# Patient Record
Sex: Female | Born: 1970 | Race: White | Hispanic: No | Smoking: Never smoker
Health system: Southern US, Community
[De-identification: ages and names within clinical notes are randomized; demographics above are authoritative.]

## PROBLEM LIST (undated history)

## (undated) DIAGNOSIS — F419 Anxiety disorder, unspecified: Secondary | ICD-10-CM

## (undated) DIAGNOSIS — E079 Disorder of thyroid, unspecified: Secondary | ICD-10-CM

## (undated) DIAGNOSIS — K589 Irritable bowel syndrome without diarrhea: Secondary | ICD-10-CM

## (undated) HISTORY — PX: BREAST SURGERY: SHX581

## (undated) HISTORY — DX: Disorder of thyroid, unspecified: E07.9

## (undated) HISTORY — PX: NOSE SURGERY: SHX723

## (undated) HISTORY — DX: Anxiety disorder, unspecified: F41.9

## (undated) HISTORY — PX: UTERINE FIBROID SURGERY: SHX826

---

## 2019-07-07 DIAGNOSIS — R0902 Hypoxemia: Secondary | ICD-10-CM | POA: Insufficient documentation

## 2019-07-13 ENCOUNTER — Emergency Department: Payer: Self-pay

## 2019-07-13 ENCOUNTER — Observation Stay
Admission: EM | Admit: 2019-07-13 | Discharge: 2019-07-14 | Disposition: A | Discharge status: Home / Self Care | Payer: Self-pay | Attending: Internal Medicine | Admitting: Internal Medicine

## 2019-07-13 ENCOUNTER — Encounter: Payer: Self-pay | Admitting: Emergency Medicine

## 2019-07-13 ENCOUNTER — Other Ambulatory Visit: Payer: Self-pay

## 2019-07-13 DIAGNOSIS — F419 Anxiety disorder, unspecified: Secondary | ICD-10-CM | POA: Insufficient documentation

## 2019-07-13 DIAGNOSIS — R079 Chest pain, unspecified: Principal | ICD-10-CM | POA: Insufficient documentation

## 2019-07-13 DIAGNOSIS — R0789 Other chest pain: Secondary | ICD-10-CM | POA: Diagnosis present

## 2019-07-13 DIAGNOSIS — K589 Irritable bowel syndrome without diarrhea: Secondary | ICD-10-CM | POA: Insufficient documentation

## 2019-07-13 DIAGNOSIS — Z20822 Contact with and (suspected) exposure to covid-19: Secondary | ICD-10-CM | POA: Insufficient documentation

## 2019-07-13 DIAGNOSIS — K7689 Other specified diseases of liver: Secondary | ICD-10-CM | POA: Insufficient documentation

## 2019-07-13 DIAGNOSIS — E876 Hypokalemia: Secondary | ICD-10-CM | POA: Insufficient documentation

## 2019-07-13 DIAGNOSIS — R0602 Shortness of breath: Secondary | ICD-10-CM | POA: Insufficient documentation

## 2019-07-13 HISTORY — DX: Irritable bowel syndrome, unspecified: K58.9

## 2019-07-13 LAB — TROPONIN I (HIGH SENSITIVITY)
Troponin I (High Sensitivity): <2 ng/L
Troponin I (High Sensitivity): <2 ng/L

## 2019-07-13 LAB — URINALYSIS, COMPLETE (UACMP) WITH MICROSCOPIC
Bilirubin Urine: NEGATIVE
Glucose, UA: NEGATIVE mg/dL
Ketones, ur: NEGATIVE mg/dL
Leukocytes,Ua: NEGATIVE
Nitrite: NEGATIVE
Protein, ur: NEGATIVE mg/dL
Specific Gravity, Urine: 1.001 — ABNORMAL LOW (ref 1.005–1.030)
pH: 7 (ref 5.0–8.0)

## 2019-07-13 LAB — CBC
HCT: 36.2 % (ref 36.0–46.0)
Hemoglobin: 12.2 g/dL (ref 12.0–15.0)
MCH: 30 pg (ref 26.0–34.0)
MCHC: 33.7 g/dL (ref 30.0–36.0)
MCV: 88.9 fL (ref 80.0–100.0)
Platelets: 263 10*3/uL (ref 150–400)
RBC: 4.07 MIL/uL (ref 3.87–5.11)
RDW: 13.8 % (ref 11.5–15.5)
WBC: 9 10*3/uL (ref 4.0–10.5)
nRBC: 0 % (ref 0.0–0.2)

## 2019-07-13 LAB — BLOOD GAS, ARTERIAL
Acid-base deficit: 0.9 mmol/L (ref 0.0–2.0)
Bicarbonate: 21.8 mmol/L (ref 20.0–28.0)
FIO2: 21
O2 Saturation: 99.1 %
Patient temperature: 37
pCO2 arterial: 30 mmHg — ABNORMAL LOW (ref 32.0–48.0)
pH, Arterial: 7.47 — ABNORMAL HIGH (ref 7.350–7.450)
pO2, Arterial: 129 mmHg — ABNORMAL HIGH (ref 83.0–108.0)

## 2019-07-13 LAB — HEPATIC FUNCTION PANEL
ALT: 17 U/L (ref 0–44)
AST: 16 U/L (ref 15–41)
Albumin: 3.9 g/dL (ref 3.5–5.0)
Alkaline Phosphatase: 52 U/L (ref 38–126)
Bilirubin, Direct: <0.1 mg/dL (ref 0.0–0.2)
Total Bilirubin: 0.6 mg/dL (ref 0.3–1.2)
Total Protein: 6.4 g/dL — ABNORMAL LOW (ref 6.5–8.1)

## 2019-07-13 LAB — BASIC METABOLIC PANEL WITH GFR
Anion gap: 9 (ref 5–15)
BUN: 7 mg/dL (ref 6–20)
CO2: 23 mmol/L (ref 22–32)
Calcium: 9.1 mg/dL (ref 8.9–10.3)
Chloride: 101 mmol/L (ref 98–111)
Creatinine, Ser: 0.58 mg/dL (ref 0.44–1.00)
GFR calc Af Amer: >60 mL/min
GFR calc non Af Amer: >60 mL/min
Glucose, Bld: 105 mg/dL — ABNORMAL HIGH (ref 70–99)
Potassium: 3.3 mmol/L — ABNORMAL LOW (ref 3.5–5.1)
Sodium: 133 mmol/L — ABNORMAL LOW (ref 135–145)

## 2019-07-13 LAB — FIBRIN DERIVATIVES D-DIMER (ARMC ONLY): Fibrin derivatives D-dimer (ARMC): 228.74 ng{FEU}/mL (ref 0.00–499.00)

## 2019-07-13 LAB — TSH: TSH: 0.674 u[IU]/mL (ref 0.350–4.500)

## 2019-07-13 LAB — BRAIN NATRIURETIC PEPTIDE: B Natriuretic Peptide: 30 pg/mL (ref 0.0–100.0)

## 2019-07-13 MED ORDER — SODIUM CHLORIDE 0.9% FLUSH: 3.0000 mL | Freq: Once | INTRAVENOUS | Status: DC

## 2019-07-13 MED ORDER — IOHEXOL 350 MG/ML SOLN
75.0000 mL | Freq: Once | INTRAVENOUS | Status: AC | PRN
  Administered 2019-07-13: 75 mL via INTRAVENOUS

## 2019-07-13 MED ORDER — ALPRAZOLAM 0.5 MG PO TABS
0.5000 mg | ORAL_TABLET | Freq: Once | ORAL | Status: AC
  Administered 2019-07-13: 0.5 mg via ORAL
  Filled 2019-07-13: qty 1

## 2019-07-13 MED ORDER — ALPRAZOLAM 0.5 MG PO TABS
0.2500 mg | ORAL_TABLET | Freq: Once | ORAL | Status: AC
  Administered 2019-07-13: 0.25 mg via ORAL
  Filled 2019-07-13: qty 1

## 2019-07-13 NOTE — ED Notes (Signed)
First Nurse Note: Pt to ED c/o hypoxia. Pt states that her SpO2 level dropped to 69% while in the car. Pt states that she took "oxygen drops and they saved her". Pt states that this has been an ongoing issue. Pt is speaking in complete sentences at this time. Respirations are equal an unlabored at this time. Pt is extremely anxious upon arrival

## 2019-07-13 NOTE — ED Notes (Addendum)
Pt reports for last 10 days she has had episodes of hypoxia decreasing into 60s.  Pt reports this happened on way to this  ED; hands were shaking during this episode per pt.  Did have some chest tightness on way here.    Has had difficulty breathing over last 10 days.  Been seen at [facility].  Getting glutathione shots.  Pt wanted to go into hyperbaric chamber to treat sx.

## 2019-07-13 NOTE — ED Notes (Signed)
Pt back to room from CT att 

## 2019-07-13 NOTE — ED Notes (Signed)
Lab called for add on 

## 2019-07-13 NOTE — ED Notes (Signed)
Called daughter, she reports she had to go home. Will give pt phone to call her once doctor done seeing her.

## 2019-07-13 NOTE — ED Notes (Signed)
Pt to CT att

## 2019-07-13 NOTE — ED Notes (Signed)
pt requesting ABG "to check the oxygen levels IN the cells" -- EDP notified

## 2019-07-13 NOTE — ED Triage Notes (Signed)
Pt to triage 3 by wheelchair with rambling conversation.  She sates to this RN tha she could not tell the story again.  Reports to First Nurse that he O2 dropped into 60s and she could not breathe.  Pt reports same symptoms happening frequently over last several weeks.  Daughter stated to first nurse that pt took "Oxygen Drops" to help on her way here.  Pt also reports diarrhea.

## 2019-07-13 NOTE — ED Notes (Signed)
Pt given water, blanket, phone called daughter for pt

## 2019-07-13 NOTE — ED Notes (Signed)
Lab called for UDS add on

## 2019-07-13 NOTE — ED Provider Notes (Signed)
[facility] Emergency Department Provider Note    First MD Initiated Contact with Patient 07/13/19 1816     (approximate)  I have reviewed the triage vital signs and the nursing notes.   HISTORY  Chief Complaint Weakness    HPI Isabel Meyers is a 49 y.o. female   with the below listed past medical history presents to the ER for evaluation of shortness of breath.  States that the symptoms started roughly 10 days ago and are progressively worsening.  States that she went on a long hike with her daughter and then went back to her apartment and smelled something that was burning her nares and had tingling in her face.  Patient with a hospital in [place] for evaluation.  They were unable to find source of shortness of breath and symptoms.  States that symptoms are becoming more severe over the past several days.  She does not smoke.  Was previously healthy.  Denies any history of congestive heart failure.  States that she does feel very wired very frustrated having difficulty sleeping.  Denies any hallucinations.  No SI or HI.   Past Medical History:  Diagnosis Date  . Irritable bowel    History reviewed. No pertinent family history. History reviewed. No pertinent surgical history. There are no problems to display for this patient.     Prior to Admission medications   Not on File    Allergies Levaquin [levofloxacin], Selsun blue dry scalp [pyrithione zinc], and Salicylic acid    Social History Social History   Tobacco Use  . Smoking status: Never Smoker  . Smokeless tobacco: Never Used  Substance Use Topics  . Alcohol use: Never  . Drug use: Never    Review of Systems Patient denies headaches, rhinorrhea, blurry vision, numbness, shortness of breath, chest pain, edema, cough, abdominal pain, nausea, vomiting, diarrhea, dysuria, fevers, rashes or hallucinations unless otherwise stated above in  HPI. ____________________________________________   PHYSICAL EXAM:  VITAL SIGNS: Vitals:   07/13/19 2100 07/13/19 2231  BP: 124/85 124/90  Pulse: 96 88  Resp: 20 16  Temp:    SpO2: 100% 100%    Constitutional: Alert and oriented.  Eyes: Conjunctivae are normal.  Head: Atraumatic. Nose: No congestion/rhinnorhea. Mouth/Throat: Mucous membranes are moist.   Neck: No stridor. Painless ROM.  Cardiovascular: Normal rate, regular rhythm. Grossly normal heart sounds.  Good peripheral circulation. Respiratory: Normal respiratory effort.  No retractions. Lungs CTAB. Gastrointestinal: Soft and nontender. No distention. No abdominal bruits. No CVA tenderness. Genitourinary: deferred Musculoskeletal: No lower extremity tenderness nor edema.  No joint effusions. Neurologic:  Normal speech and language. No gross focal neurologic deficits are appreciated. No facial droop Skin:  Skin is warm, dry and intact. No rash noted. Psychiatric: Mood and affect are normal. Speech and behavior are normal.  ____________________________________________   LABS (all labs ordered are listed, but only abnormal results are displayed)  Results for orders placed or performed during the hospital encounter of 07/13/19 (from the past 24 hour(s))  Basic metabolic panel     Status: Abnormal   Collection Time: 07/13/19  6:01 PM  Result Value Ref Range   Sodium 133 (L) 135 - 145 mmol/L   Potassium 3.3 (L) 3.5 - 5.1 mmol/L   Chloride 101 98 - 111 mmol/L   CO2 23 22 - 32 mmol/L   Glucose, Bld 105 (H) 70 - 99 mg/dL   BUN 7 6 - 20 mg/dL   Creatinine, Ser 2.54 0.44 -  1.00 mg/dL   Calcium 9.1 8.9 - 38.2 mg/dL   GFR calc non Af Amer >60 >60 mL/min   GFR calc Af Amer >60 >60 mL/min   Anion gap 9 5 - 15  CBC     Status: None   Collection Time: 07/13/19  6:01 PM  Result Value Ref Range   WBC 9.0 4.0 - 10.5 K/uL   RBC 4.07 3.87 - 5.11 MIL/uL   Hemoglobin 12.2 12.0 - 15.0 g/dL   HCT 50.5 39.7 - 67.3 %   MCV 88.9  80.0 - 100.0 fL   MCH 30.0 26.0 - 34.0 pg   MCHC 33.7 30.0 - 36.0 g/dL   RDW 41.9 37.9 - 02.4 %   Platelets 263 150 - 400 K/uL   nRBC 0.0 0.0 - 0.2 %  Urinalysis, Complete w Microscopic     Status: Abnormal   Collection Time: 07/13/19  6:01 PM  Result Value Ref Range   Color, Urine COLORLESS (A) YELLOW   APPearance CLEAR (A) CLEAR   Specific Gravity, Urine 1.001 (L) 1.005 - 1.030   pH 7.0 5.0 - 8.0   Glucose, UA NEGATIVE NEGATIVE mg/dL   Hgb urine dipstick LARGE (A) NEGATIVE   Bilirubin Urine NEGATIVE NEGATIVE   Ketones, ur NEGATIVE NEGATIVE mg/dL   Protein, ur NEGATIVE NEGATIVE mg/dL   Nitrite NEGATIVE NEGATIVE   Leukocytes,Ua NEGATIVE NEGATIVE   RBC / HPF 0-5 0 - 5 RBC/hpf   WBC, UA 0-5 0 - 5 WBC/hpf   Bacteria, UA RARE (A) NONE SEEN   Squamous Epithelial / LPF 0-5 0 - 5  TSH     Status: None   Collection Time: 07/13/19  7:52 PM  Result Value Ref Range   TSH 0.674 0.350 - 4.500 uIU/mL  Troponin I (High Sensitivity)     Status: None   Collection Time: 07/13/19  7:52 PM  Result Value Ref Range   Troponin I (High Sensitivity) <2 <18 ng/L  Fibrin derivatives D-Dimer ([facility] only)     Status: None   Collection Time: 07/13/19  7:52 PM  Result Value Ref Range   Fibrin derivatives D-dimer ([facility]) 228.74 0.00 - 499.00 ng/mL (FEU)  Brain natriuretic peptide     Status: None   Collection Time: 07/13/19  7:52 PM  Result Value Ref Range   B Natriuretic Peptide 30.0 0.0 - 100.0 pg/mL  Blood gas, arterial     Status: Abnormal   Collection Time: 07/13/19  8:14 PM  Result Value Ref Range   FIO2 21.00    pH, Arterial 7.47 (H) 7.350 - 7.450   pCO2 arterial 30 (L) 32.0 - 48.0 mmHg   pO2, Arterial 129 (H) 83.0 - 108.0 mmHg   Bicarbonate 21.8 20.0 - 28.0 mmol/L   Acid-base deficit 0.9 0.0 - 2.0 mmol/L   O2 Saturation 99.1 %   Patient temperature 37.0    Collection site RIGHT RADIAL    Sample type ARTERIAL DRAW    Allens test (pass/fail) PASS PASS  Hepatic function panel     Status:  Abnormal   Collection Time: 07/13/19  9:15 PM  Result Value Ref Range   Total Protein 6.4 (L) 6.5 - 8.1 g/dL   Albumin 3.9 3.5 - 5.0 g/dL   AST 16 15 - 41 U/L   ALT 17 0 - 44 U/L   Alkaline Phosphatase 52 38 - 126 U/L   Total Bilirubin 0.6 0.3 - 1.2 mg/dL   Bilirubin, Direct <0.9 0.0 - 0.2 mg/dL  Indirect Bilirubin NOT CALCULATED 0.3 - 0.9 mg/dL   ____________________________________________  EKG My review and personal interpretation at Time: 17:53   Indication: chest pain  Rate: 120  Rhythm: sinus Axis: normal Other: inferolateral st depression, no stemi criteria,   My review and personal interpretation at Time: 22:21   Indication: chest pain  Rate: 85  Rhythm: sinus Axis: normal  Other: normal intervals, st changes have resolve  ____________________________________________  RADIOLOGY  I personally reviewed all radiographic images ordered to evaluate for the above acute complaints and reviewed radiology reports and findings.  These findings were personally discussed with the patient.  Please see medical record for radiology report.  ____________________________________________   PROCEDURES  Procedure(s) performed:  Procedures    Critical Care performed: no ____________________________________________   INITIAL IMPRESSION / ASSESSMENT AND PLAN / ED COURSE  Pertinent labs & imaging results that were available during my care of the patient were reviewed by me and considered in my medical decision making (see chart for details).   DDX: ACS, pericarditis, esophagitis, boerhaaves, pe, dissection, pna, bronchitis, costochondritis   Jaklyn Alen is a 49 y.o. who presents to the ED with presentation as described above.  Patient severely anxious appearing mildly tachycardic initial EKG shows ST depressions.  She was having chest discomfort therefore concerning for ACS seem to the chest pain has somewhat resolved or improved however patient's primary concern seems to be  shortness of breath related to these episodes.  Question whether she is having dysrhythmia.  Patient very anxious but does agree to take Xanax.  Will send blood work for the but differential.  Patient also requesting ABG to check oxygen levels.  Clinical Course as of Jul 12 2245  Sat Jul 13, 2019  2056 Patient initially reluctant and unwilling to have CT angiogram ordered.  Now is agreeable.  Heart rate improved.   [PR]  2209 CTA does not show any evidence of PE and her D-dimer is negative.  Troponin initially negative.  ABG that the patient requested shows respiratory alkalosis consistent with hyperventilation.   [PR]  2237 Blood work so far has been reassuring.  However patient's not having any pain at this time.  Repeat EKG does show resolution of the ST changes previously seen.  Troponin negative but given her age risk factors and symptoms with initial ST depressions I do believe she should be observed in the hospital for further medical management.  Have discussed with the patient and available family all diagnostics and treatments performed thus far and all questions were answered to the best of my ability. The patient demonstrates understanding and agreement with plan.    [PR]    Clinical Course User Index [PR] Merlyn Lot, MD    The patient was evaluated in Emergency Department today for the symptoms described in the history of present illness. He/she was evaluated in the context of the global COVID-19 pandemic, which necessitated consideration that the patient might be at risk for infection with the SARS-CoV-2 virus that causes COVID-19. Institutional protocols and algorithms that pertain to the evaluation of patients at risk for COVID-19 are in a state of rapid change based on information released by regulatory bodies including the CDC and federal and state organizations. These policies and algorithms were followed during the patient's care in the ED.  As part of my medical decision  making, I reviewed the following data within the [place] notes reviewed and incorporated, Labs reviewed, notes from prior ED visits and [place]  Controlled Substance Database   ____________________________________________   FINAL CLINICAL IMPRESSION(S) / ED DIAGNOSES  Final diagnoses:  Chest pain, unspecified type  Shortness of breath      NEW MEDICATIONS STARTED DURING THIS VISIT:  New Prescriptions   No medications on file     Note:  This document was prepared using Dragon voice recognition software and may include unintentional dictation errors.    Willy Eddy, MD 07/13/19 [phone]

## 2019-07-13 NOTE — ED Notes (Signed)
Family at bedside. - daughter  Reports cold hands on pt

## 2019-07-14 ENCOUNTER — Encounter: Payer: Self-pay | Admitting: Internal Medicine

## 2019-07-14 DIAGNOSIS — I259 Chronic ischemic heart disease, unspecified: Secondary | ICD-10-CM

## 2019-07-14 DIAGNOSIS — R0789 Other chest pain: Secondary | ICD-10-CM

## 2019-07-14 DIAGNOSIS — R0602 Shortness of breath: Secondary | ICD-10-CM

## 2019-07-14 LAB — URINE DRUG SCREEN, QUALITATIVE (ARMC ONLY)
Amphetamines, Ur Screen: NOT DETECTED
Barbiturates, Ur Screen: NOT DETECTED
Benzodiazepine, Ur Scrn: NOT DETECTED
Cannabinoid 50 Ng, Ur ~~LOC~~: NOT DETECTED
Cocaine Metabolite,Ur ~~LOC~~: NOT DETECTED
MDMA (Ecstasy)Ur Screen: NOT DETECTED
Methadone Scn, Ur: NOT DETECTED
Opiate, Ur Screen: NOT DETECTED
Phencyclidine (PCP) Ur S: NOT DETECTED
Tricyclic, Ur Screen: NOT DETECTED

## 2019-07-14 LAB — BASIC METABOLIC PANEL
Anion gap: 3 — ABNORMAL LOW (ref 5–15)
BUN: 8 mg/dL (ref 6–20)
CO2: 26 mmol/L (ref 22–32)
Calcium: 8.6 mg/dL — ABNORMAL LOW (ref 8.9–10.3)
Chloride: 109 mmol/L (ref 98–111)
Creatinine, Ser: 0.58 mg/dL (ref 0.44–1.00)
GFR calc Af Amer: >60 mL/min (ref >60)
GFR calc non Af Amer: >60 mL/min (ref >60)
Glucose, Bld: 99 mg/dL (ref 70–99)
Potassium: 4.3 mmol/L (ref 3.5–5.1)
Sodium: 138 mmol/L (ref 135–145)

## 2019-07-14 LAB — CBC
HCT: 34.6 % — ABNORMAL LOW (ref 36.0–46.0)
Hemoglobin: 11.4 g/dL — ABNORMAL LOW (ref 12.0–15.0)
MCH: 29.2 pg (ref 26.0–34.0)
MCHC: 32.9 g/dL (ref 30.0–36.0)
MCV: 88.7 fL (ref 80.0–100.0)
Platelets: 262 10*3/uL (ref 150–400)
RBC: 3.9 MIL/uL (ref 3.87–5.11)
RDW: 13.9 % (ref 11.5–15.5)
WBC: 8.4 10*3/uL (ref 4.0–10.5)
nRBC: 0 % (ref 0.0–0.2)

## 2019-07-14 LAB — SARS CORONAVIRUS 2 (TAT 6-24 HRS): SARS Coronavirus 2: NEGATIVE

## 2019-07-14 LAB — LIPID PANEL
Cholesterol: 159 mg/dL (ref 0–200)
HDL: 69 mg/dL (ref >40)
LDL Cholesterol: 83 mg/dL (ref 0–99)
Total CHOL/HDL Ratio: 2.3 RATIO
Triglycerides: 35 mg/dL (ref <150)
VLDL: 7 mg/dL (ref 0–40)

## 2019-07-14 LAB — HIV ANTIBODY (ROUTINE TESTING W REFLEX): HIV Screen 4th Generation wRfx: NONREACTIVE

## 2019-07-14 MED ORDER — POTASSIUM CHLORIDE 20 MEQ PO PACK
40.0000 meq | PACK | Freq: Once | ORAL | Status: AC
  Administered 2019-07-14: 40 meq via ORAL
  Filled 2019-07-14: qty 2

## 2019-07-14 MED ORDER — SODIUM CHLORIDE 0.9 % IV SOLN: INTRAVENOUS | Status: DC

## 2019-07-14 MED ORDER — ASPIRIN 300 MG RE SUPP: 300.0000 mg | RECTAL | Status: AC

## 2019-07-14 MED ORDER — NITROGLYCERIN 0.4 MG SL SUBL: 0.4000 mg | SUBLINGUAL_TABLET | SUBLINGUAL | Status: DC | PRN

## 2019-07-14 MED ORDER — ASPIRIN EC 81 MG PO TBEC
81.0000 mg | DELAYED_RELEASE_TABLET | Freq: Every day | ORAL | Status: DC
  Administered 2019-07-14: 81 mg via ORAL
  Filled 2019-07-14: qty 1

## 2019-07-14 MED ORDER — ATORVASTATIN CALCIUM 20 MG PO TABS: 40.0000 mg | ORAL_TABLET | Freq: Every day | ORAL | Status: DC

## 2019-07-14 MED ORDER — ALPRAZOLAM 0.5 MG PO TABS: 0.5000 mg | ORAL_TABLET | Freq: Three times a day (TID) | ORAL | Status: DC | PRN

## 2019-07-14 MED ORDER — ASPIRIN 81 MG PO CHEW
324.0000 mg | CHEWABLE_TABLET | ORAL | Status: AC
  Administered 2019-07-14: 324 mg via ORAL
  Filled 2019-07-14: qty 4

## 2019-07-14 MED ORDER — ONDANSETRON HCL 4 MG/2ML IJ SOLN: 4.0000 mg | Freq: Four times a day (QID) | INTRAMUSCULAR | Status: DC | PRN

## 2019-07-14 MED ORDER — ACETAMINOPHEN 325 MG PO TABS: 650.0000 mg | ORAL_TABLET | ORAL | Status: DC | PRN

## 2019-07-14 MED ORDER — ENOXAPARIN SODIUM 40 MG/0.4ML ~~LOC~~ SOLN
40.0000 mg | SUBCUTANEOUS | Status: DC
  Filled 2019-07-14: qty 0.4

## 2019-07-14 NOTE — ED Notes (Signed)
Pt given another blanket and pillow and DC 'd from monitor as per request for pt could sleep; lights dimmed

## 2019-07-14 NOTE — Discharge Summary (Addendum)
Physician Discharge Summary  Isabel Meyers FTD:[id number] DOB: February 26, 1971 DOA: 07/13/2019  PCP: Patient, No Pcp Per  Admit date: 07/13/2019 Discharge date: 07/16/2019  Discharge disposition: Home   Recommendations for Outpatient Follow-Up:   Outpatient follow-up with PCP   Discharge Diagnosis:   Principal Problem:   Shortness of breath Active Problems:   Chest tightness    Discharge Condition: Stable.  Diet recommendation: Regular diet  Code status: Full code.    Hospital Course:   Isabel Meyers is a 49 year old woman who presented to the hospital because of increasing shortness of breath, chest tightness and decreased oxygen saturation.  She said she had been checking her oxygen saturation at home and apparently was in the low ?60s.  She said she felt tremulous and shaky.  She had been seen at [facility] where she had an echocardiogram which revealed an EF estimated at 55 to 60%, mild MR and there were no significant findings on echo.  She was admitted to telemetry for observation.  CT angiogram of the chest was unremarkable.  In the hospital, patient was not hypoxemic but she insisted that she be discharged home with oxygen because she was hypoxemic.  She walked about 5 or 6 labs on the medical ward but her oxygen saturation remained normal.  However, heart rate went up into the 120s with walking and returned to normal with rest.  Her sensation of shortness of breath was attributed to anxiety.  However, patient denied any anxiety and she believes that her symptoms were real.  She has been advised to follow-up with a primary care physician for close follow-up and for routine health maintenance.     Discharge Exam:   Vitals:   07/14/19 0749 07/14/19 1131  BP: 95/67 101/76  Pulse: 83 (!) 102  Resp: 18 20  Temp: 98.7 F (37.1 C) 98 F (36.7 C)  SpO2: 100% 100%   Vitals:   07/14/19 0416 07/14/19 0500 07/14/19 0749 07/14/19 1131  BP: 92/69  95/67 101/76    Pulse: 71  83 (!) 102  Resp:   18 20  Temp:   98.7 F (37.1 C) 98 F (36.7 C)  TempSrc:   Oral Oral  SpO2:   100% 100%  Weight:  63 kg    Height:         GEN: NAD SKIN: No rash EYES: EOMI ENT: MMM CV: RRR PULM: CTA B ABD: soft, ND, NT, +BS CNS: AAO x 3, non focal EXT: No edema or tenderness PSYCH: Anxious, verbose. No hallucinations/delusions   The results of significant diagnostics from this hospitalization (including imaging, microbiology, ancillary and laboratory) are listed below for reference.     Procedures and Diagnostic Studies:   DG Chest 2 View  Result Date: 07/13/2019 CLINICAL DATA:  Shortness of breath, concern for infiltrate versus edema EXAM: CHEST - 2 VIEW COMPARISON:  None. FINDINGS: No consolidation, features of edema, pneumothorax, or effusion. The cardiomediastinal contours are unremarkable. No acute osseous or soft tissue abnormality. Bilateral breast prostheses result in increased attenuation of the upper lung parenchyma. IMPRESSION: No acute cardiopulmonary findings. Electronically Signed   By: Lovena Le M.D.   On: 07/13/2019 19:44   CT Angio Chest PE W and/or Wo Contrast  Result Date: 07/13/2019 CLINICAL DATA:  Shortness of breath. EXAM: CT ANGIOGRAPHY CHEST WITH CONTRAST TECHNIQUE: Multidetector CT imaging of the chest was performed using the standard protocol during bolus administration of intravenous contrast. Multiplanar CT image reconstructions and MIPs were obtained to  evaluate the vascular anatomy. CONTRAST:  78mL OMNIPAQUE IOHEXOL 350 MG/ML SOLN COMPARISON:  None. FINDINGS: Cardiovascular: Evaluation for pulmonary emboli is degraded at the segmental and subsegmental level secondary to motion artifact, especially at the lung bases.There is no pulmonary embolus. The main pulmonary artery is within normal limits for size. There is no CT evidence of acute right heart strain. The visualized aorta is normal. Heart size is normal, without pericardial  effusion. Mediastinum/Nodes: --No mediastinal or hilar lymphadenopathy. --No axillary lymphadenopathy. --No supraclavicular lymphadenopathy. --Normal thyroid gland. --The esophagus is unremarkable Lungs/Pleura: No pulmonary nodules or masses. No pleural effusion or pneumothorax. No focal airspace consolidation. No focal pleural abnormality. Upper Abdomen: Multiple hepatic cysts are noted. Musculoskeletal: No chest wall abnormality. No acute or significant osseous findings. Review of the MIP images confirms the above findings. IMPRESSION: 1. Evaluation for pulmonary emboli is degraded at the segmental and subsegmental level secondary to motion artifact, especially at the lung bases. Given this limitation, no acute pulmonary embolism was detected. 2. There is no acute cardiopulmonary process.  The lungs are clear. Electronically Signed   By: Katherine Mantle M.D.   On: 07/13/2019 21:42     Labs:   Basic Metabolic Panel: Recent Labs  Lab 07/13/19 1801 07/14/19 0533  NA 133* 138  K 3.3* 4.3  CL 101 109  CO2 23 26  GLUCOSE 105* 99  BUN 7 8  CREATININE 0.58 0.58  CALCIUM 9.1 8.6*   GFR Estimated Creatinine Clearance: 72.6 mL/min (by C-G formula based on SCr of 0.58 mg/dL). Liver Function Tests: Recent Labs  Lab 07/13/19 2115  AST 16  ALT 17  ALKPHOS 52  BILITOT 0.6  PROT 6.4*  ALBUMIN 3.9   No results for input(s): LIPASE, AMYLASE in the last 168 hours. No results for input(s): AMMONIA in the last 168 hours. Coagulation profile No results for input(s): INR, PROTIME in the last 168 hours.  CBC: Recent Labs  Lab 07/13/19 1801 07/14/19 0533  WBC 9.0 8.4  HGB 12.2 11.4*  HCT 36.2 34.6*  MCV 88.9 88.7  PLT 263 262   Cardiac Enzymes: No results for input(s): CKTOTAL, CKMB, CKMBINDEX, TROPONINI in the last 168 hours. BNP: Invalid input(s): POCBNP CBG: No results for input(s): GLUCAP in the last 168 hours. D-Dimer No results for input(s): DDIMER in the last 72 hours. Hgb  A1c No results for input(s): HGBA1C in the last 72 hours. Lipid Profile Recent Labs    07/14/19 0533  CHOL 159  HDL 69  LDLCALC 83  TRIG 35  CHOLHDL 2.3   Thyroid function studies Recent Labs    07/13/19 1952  TSH 0.674   Anemia work up No results for input(s): VITAMINB12, FOLATE, FERRITIN, TIBC, IRON, RETICCTPCT in the last 72 hours. Microbiology Recent Results (from the past 240 hour(s))  SARS CORONAVIRUS 2 (TAT 6-24 HRS) Nasopharyngeal Nasopharyngeal Swab     Status: None   Collection Time: 07/14/19 12:40 AM   Specimen: Nasopharyngeal Swab  Result Value Ref Range Status   SARS Coronavirus 2 NEGATIVE NEGATIVE Final    Comment: (NOTE) SARS-CoV-2 target nucleic acids are NOT DETECTED. The SARS-CoV-2 RNA is generally detectable in upper and lower respiratory specimens during the acute phase of infection. Negative results do not preclude SARS-CoV-2 infection, do not rule out co-infections with other pathogens, and should not be used as the sole basis for treatment or other patient management decisions. Negative results must be combined with clinical observations, patient history, and epidemiological information. The expected result  is Negative. Fact Sheet for Patients: HairSlick.no Fact Sheet for Healthcare Providers: quierodirigir.com This test is not yet approved or cleared by the [place] FDA and  has been authorized for detection and/or diagnosis of SARS-CoV-2 by FDA under an Emergency Use Authorization (EUA). This EUA will remain  in effect (meaning this test can be used) for the duration of the COVID-19 declaration under Section 56 4(b)(1) of the Act, 21 U.S.C. section 360bbb-3(b)(1), unless the authorization is terminated or revoked sooner. Performed at [facility], [address] [place], [place] [address]      Discharge Instructions:   Discharge Instructions    Diet general   Complete by:  As directed    Increase activity slowly   Complete by: As directed      Allergies as of 07/14/2019      Reactions   Selsun Blue Dry Scalp [pyrithione Zinc] Shortness Of Breath   Levaquin [levofloxacin] Other (See Comments)   "I almost died"   Salicylic Acid Rash      Medication List    You have not been prescribed any medications.       Time coordinating discharge: 28 minutes  Signed:  Brendalee Matthies  [facility] 07/16/2019, 5:37 PM

## 2019-07-14 NOTE — H&P (Signed)
Chief Complaint: I feel short of breath HPI: Isabel Meyers is an 49 y.o. female with medical history . Patient is verbose with her history. Daughter is at bedside. Patient apparently had been well until about 2 weeks ago when she started experiencing symptoms of anxiety and a feeling of shortness of breath. She had been checking her pulse oximeter and noted it to be in the low 80's . She is reported to have felt tremulous and shaken and was seen at [facility], Therese Sarah and had been worked up with ECHO. As per patient she continues to have symptoms being hypoxic on her pulse oximeter. Last episode was earl;ier today whilst she was shopping with daughter.She decided to come to the Emergency room. Daughter at bedside, admits , mother has been acting awkward lately. Patient was defensive when inquired into history of anxiety disorder. She states her symptoms may be due to mold poisoning in her home.Denies ETOH abuse,She denies any known history CAD. No recent history of Cardiac stress test.Denies HI/SI.  Past Medical History:  Diagnosis Date  . Irritable bowel     History reviewed. No pertinent surgical history.  History reviewed. No pertinent family history. Social History:  reports that she has never smoked. She has never used smokeless tobacco. She reports that she does not drink alcohol or use drugs.  Allergies:  Allergies  Allergen Reactions  . Selsun Blue Dry Scalp [Pyrithione Zinc] Shortness Of Breath  . Levaquin [Levofloxacin] Other (See Comments)    "I almost died"  . Salicylic Acid Rash    (Not in a hospital admission)   Results for orders placed or performed during the hospital encounter of 07/13/19 (from the past 48 hour(s))  Basic metabolic panel     Status: Abnormal   Collection Time: 07/13/19  6:01 PM  Result Value Ref Range   Sodium 133 (L) 135 - 145 mmol/L   Potassium 3.3 (L) 3.5 - 5.1 mmol/L   Chloride 101 98 - 111 mmol/L   CO2 23 22 - 32 mmol/L   Glucose, Bld 105 (H)  70 - 99 mg/dL    Comment: Glucose reference range applies only to samples taken after fasting for at least 8 hours.   BUN 7 6 - 20 mg/dL   Creatinine, Ser 0.58 0.44 - 1.00 mg/dL   Calcium 9.1 8.9 - 10.3 mg/dL   GFR calc non Af Amer >60 >60 mL/min   GFR calc Af Amer >60 >60 mL/min   Anion gap 9 5 - 15    Comment: Performed at [facility], [address] [place], [place] [address]  CBC     Status: None   Collection Time: 07/13/19  6:01 PM  Result Value Ref Range   WBC 9.0 4.0 - 10.5 K/uL   RBC 4.07 3.87 - 5.11 MIL/uL   Hemoglobin 12.2 12.0 - 15.0 g/dL   HCT 36.2 36.0 - 46.0 %   MCV 88.9 80.0 - 100.0 fL   MCH 30.0 26.0 - 34.0 pg   MCHC 33.7 30.0 - 36.0 g/dL   RDW 13.8 11.5 - 15.5 %   Platelets 263 150 - 400 K/uL   nRBC 0.0 0.0 - 0.2 %    Comment: Performed at [facility], [address] [place], [place] [address]  Urinalysis, Complete w Microscopic     Status: Abnormal   Collection Time: 07/13/19  6:01 PM  Result Value Ref Range   Color, Urine COLORLESS (A) YELLOW   APPearance CLEAR (A) CLEAR  Specific Gravity, Urine 1.001 (L) 1.005 - 1.030   pH 7.0 5.0 - 8.0   Glucose, UA NEGATIVE NEGATIVE mg/dL   Hgb urine dipstick LARGE (A) NEGATIVE   Bilirubin Urine NEGATIVE NEGATIVE   Ketones, ur NEGATIVE NEGATIVE mg/dL   Protein, ur NEGATIVE NEGATIVE mg/dL   Nitrite NEGATIVE NEGATIVE   Leukocytes,Ua NEGATIVE NEGATIVE   RBC / HPF 0-5 0 - 5 RBC/hpf   WBC, UA 0-5 0 - 5 WBC/hpf   Bacteria, UA RARE (A) NONE SEEN   Squamous Epithelial / LPF 0-5 0 - 5    Comment: Performed at [facility], [address] [place], [place] [address]  TSH     Status: None   Collection Time: 07/13/19  7:52 PM  Result Value Ref Range   TSH 0.674 0.350 - 4.500 uIU/mL    Comment: Performed by a 3rd Generation assay with a functional sensitivity of <=0.01 uIU/mL. Performed at [facility], [address] [place], [place] [address]   Troponin I (High Sensitivity)      Status: None   Collection Time: 07/13/19  7:52 PM  Result Value Ref Range   Troponin I (High Sensitivity) <2 <18 ng/L    Comment: (NOTE) Elevated high sensitivity troponin I (hsTnI) values and significant  changes across serial measurements may suggest ACS but many other  chronic and acute conditions are known to elevate hsTnI results.  Refer to the "Links" section for chest pain algorithms and additional  guidance. Performed at [facility], [address] [place], [place] [address]   Fibrin derivatives D-Dimer [facility] only)     Status: None   Collection Time: 07/13/19  7:52 PM  Result Value Ref Range   Fibrin derivatives D-dimer ([facility]) 228.74 0.00 - 499.00 ng/mL (FEU)    Comment: (NOTE) <> Exclusion of Venous Thromboembolism (VTE) - OUTPATIENT ONLY   (Emergency Department or [place])   0-499 ng/ml (FEU): With a low to intermediate pretest probability                      for VTE this test result excludes the diagnosis                      of VTE.   >499 ng/ml (FEU) : VTE not excluded; additional work up for VTE is                      required. <> Testing on Inpatients and Evaluation of Disseminated Intravascular   Coagulation (DIC) Reference Range:   0-499 ng/ml (FEU) Performed at [facility], [address] [place], [place] [address]   Brain natriuretic peptide     Status: None   Collection Time: 07/13/19  7:52 PM  Result Value Ref Range   B Natriuretic Peptide 30.0 0.0 - 100.0 pg/mL    Comment: Performed at [facility], [address] [place], [place] [address]  Blood gas, arterial     Status: Abnormal   Collection Time: 07/13/19  8:14 PM  Result Value Ref Range   FIO2 21.00    pH, Arterial 7.47 (H) 7.350 - 7.450   pCO2 arterial 30 (L) 32.0 - 48.0 mmHg   pO2, Arterial 129 (H) 83.0 - 108.0 mmHg   Bicarbonate 21.8 20.0 - 28.0 mmol/L   Acid-base deficit 0.9 0.0 - 2.0 mmol/L   O2 Saturation 99.1 %   Patient temperature 37.0    Collection  site  RIGHT RADIAL    Sample type ARTERIAL DRAW    Allens test (pass/fail) PASS PASS    Comment: Performed at [facility], [address] [place], [place] [address]  Urine Drug Screen, Qualitative ([facility] only)     Status: None   Collection Time: 07/13/19  8:48 PM  Result Value Ref Range   Tricyclic, Ur Screen NONE DETECTED NONE DETECTED   Amphetamines, Ur Screen NONE DETECTED NONE DETECTED   MDMA (Ecstasy)Ur Screen NONE DETECTED NONE DETECTED   Cocaine Metabolite,Ur [place] NONE DETECTED NONE DETECTED   Opiate, Ur Screen NONE DETECTED NONE DETECTED   Phencyclidine (PCP) Ur S NONE DETECTED NONE DETECTED   Cannabinoid 50 Ng, Ur [place] NONE DETECTED NONE DETECTED   Barbiturates, Ur Screen NONE DETECTED NONE DETECTED   Benzodiazepine, Ur Scrn NONE DETECTED NONE DETECTED   Methadone Scn, Ur NONE DETECTED NONE DETECTED    Comment: (NOTE) Tricyclics + metabolites, urine    Cutoff 1000 ng/mL Amphetamines + metabolites, urine  Cutoff 1000 ng/mL MDMA (Ecstasy), urine              Cutoff 500 ng/mL Cocaine Metabolite, urine          Cutoff 300 ng/mL Opiate + metabolites, urine        Cutoff 300 ng/mL Phencyclidine (PCP), urine         Cutoff 25 ng/mL Cannabinoid, urine                 Cutoff 50 ng/mL Barbiturates + metabolites, urine  Cutoff 200 ng/mL Benzodiazepine, urine              Cutoff 200 ng/mL Methadone, urine                   Cutoff 300 ng/mL The urine drug screen provides only a preliminary, unconfirmed analytical test result and should not be used for non-medical purposes. Clinical consideration and professional judgment should be applied to any positive drug screen result due to possible interfering substances. A more specific alternate chemical method must be used in order to obtain a confirmed analytical result. Gas chromatography / mass spectrometry (GC/MS) is the preferred confirmat ory method. Performed at [facility], [address] [place], [place]  [address]   Hepatic function panel     Status: Abnormal   Collection Time: 07/13/19  9:15 PM  Result Value Ref Range   Total Protein 6.4 (L) 6.5 - 8.1 g/dL   Albumin 3.9 3.5 - 5.0 g/dL   AST 16 15 - 41 U/L   ALT 17 0 - 44 U/L   Alkaline Phosphatase 52 38 - 126 U/L   Total Bilirubin 0.6 0.3 - 1.2 mg/dL   Bilirubin, Direct <0.9<0.1 0.0 - 0.2 mg/dL   Indirect Bilirubin NOT CALCULATED 0.3 - 0.9 mg/dL    Comment: Performed at [facility], [address] [place], [place] [address]  Troponin I (High Sensitivity)     Status: None   Collection Time: 07/13/19 10:18 PM  Result Value Ref Range   Troponin I (High Sensitivity) <2 <18 ng/L    Comment: (NOTE) Elevated high sensitivity troponin I (hsTnI) values and significant  changes across serial measurements may suggest ACS but many other  chronic and acute conditions are known to elevate hsTnI results.  Refer to the "Links" section for chest pain algorithms and additional  guidance. Performed at [facility], [address] [place], [place] [address]    DG Chest 2 View  Result Date: 07/13/2019 CLINICAL DATA:  Shortness of breath, concern for infiltrate versus edema EXAM: CHEST - 2 VIEW COMPARISON:  None. FINDINGS: No consolidation, features of edema, pneumothorax, or effusion. The cardiomediastinal contours are unremarkable. No acute osseous or soft tissue abnormality. Bilateral breast prostheses result in increased attenuation of the upper lung parenchyma. IMPRESSION: No acute cardiopulmonary findings. Electronically Signed   By: Kreg Shropshire M.D.   On: 07/13/2019 19:44   CT Angio Chest PE W and/or Wo Contrast  Result Date: 07/13/2019 CLINICAL DATA:  Shortness of breath. EXAM: CT ANGIOGRAPHY CHEST WITH CONTRAST TECHNIQUE: Multidetector CT imaging of the chest was performed using the standard protocol during bolus administration of intravenous contrast. Multiplanar CT image reconstructions and MIPs were obtained to evaluate the  vascular anatomy. CONTRAST:  63mL OMNIPAQUE IOHEXOL 350 MG/ML SOLN COMPARISON:  None. FINDINGS: Cardiovascular: Evaluation for pulmonary emboli is degraded at the segmental and subsegmental level secondary to motion artifact, especially at the lung bases.There is no pulmonary embolus. The main pulmonary artery is within normal limits for size. There is no CT evidence of acute right heart strain. The visualized aorta is normal. Heart size is normal, without pericardial effusion. Mediastinum/Nodes: --No mediastinal or hilar lymphadenopathy. --No axillary lymphadenopathy. --No supraclavicular lymphadenopathy. --Normal thyroid gland. --The esophagus is unremarkable Lungs/Pleura: No pulmonary nodules or masses. No pleural effusion or pneumothorax. No focal airspace consolidation. No focal pleural abnormality. Upper Abdomen: Multiple hepatic cysts are noted. Musculoskeletal: No chest wall abnormality. No acute or significant osseous findings. Review of the MIP images confirms the above findings. IMPRESSION: 1. Evaluation for pulmonary emboli is degraded at the segmental and subsegmental level secondary to motion artifact, especially at the lung bases. Given this limitation, no acute pulmonary embolism was detected. 2. There is no acute cardiopulmonary process.  The lungs are clear. Electronically Signed   By: Katherine Mantle M.D.   On: 07/13/2019 21:42    Review of Systems  Constitutional: Positive for fatigue.  HENT: Negative.   Respiratory: Positive for shortness of breath.   Endocrine: Negative.   Genitourinary: Negative.   Musculoskeletal: Negative.   Neurological: Negative.   Hematological: Negative.   Psychiatric/Behavioral: The patient is nervous/anxious and is hyperactive.     Blood pressure 107/70, pulse 84, temperature 98.7 F (37.1 C), temperature source Oral, resp. rate 18, height 5\' 4"  (1.626 m), weight 63.5 kg, last menstrual period 07/03/2019, SpO2 100 %. Physical Exam  Vitals  reviewed. Constitutional: She is oriented to person, place, and time. She appears well-developed and well-nourished.  HENT:  Head: Normocephalic.  Eyes: Pupils are equal, round, and reactive to light.  Cardiovascular: Normal rate.  Respiratory: Effort normal.  GI: Soft.  Musculoskeletal:     Cervical back: Normal range of motion.  Neurological: She is alert and oriented to person, place, and time.  Skin: Skin is warm.  Psychiatric: Her mood appears anxious. Her affect is blunt and inappropriate. Her speech is tangential. She is aggressive and hyperactive. She expresses impulsivity.     Assessment/Plan 1. Anxiety disorder/Panic attacks- Patients TSH was within normal limits. A trial of benzodiazepines in the ED was helpful with symptoms.Patient has denied any known history of anxiety disorder. Psych eval may be warranted considering some symptoms of paranoia.  2. Chest pain/ Abnormal EKG with  ? ST- T wave depressions.Follow up EKG showed improvement. Cardiac markers negative. Cardiac risk is low but due to her accompanying condition,patient was referred for 23 hours stay and cardiology evaluation.S/P recent ECHO at [facility]. Obtain  records  3.? Shortness of breath:  CTA ruled out PE. 273m walk test came back negative for hypoxia,Patient is not requiring oxygen and remains stable.  #4.Hypokalemia- KCl replacements were offered.  #5.  VTE prophylaxis and education  Lilia Pro, MD 07/14/2019, 1:17 AM

## 2019-07-14 NOTE — ED Notes (Signed)
Pt ambulatory approx 200 feet in hallway at brisk pace, lowest O2 sats 98%  Admitting provider at bedside informed   Pt given meal tray as requested  Daughter leaving bedside, Siriah Treat, [phone]

## 2019-07-16 DIAGNOSIS — R0602 Shortness of breath: Secondary | ICD-10-CM | POA: Diagnosis present

## 2019-07-21 NOTE — ED Notes (Signed)
ED Patient Education Note     Patient Education Materials Follows:  Allergy     Shortness of Breath    Shortness of breath means you have trouble breathing. Shortness of breath needs medical care right away.      HOME CARE     Do not smoke.      Avoid being around chemicals or things (paint fumes, dust) that may bother your breathing.      Rest as needed. Slowly begin your normal activities.     Only take medicines as told by your doctor.     Keep all doctor visits as told.    GET HELP RIGHT AWAY IF:     Your shortness of breath gets worse.     You feel lightheaded, pass out (faint), or have a cough that is not helped by medicine.     You cough up blood.     You have pain with breathing.     You have pain in your chest, arms, shoulders, or belly (abdomen).     You have a fever.     You cannot walk up stairs or exercise the way you normally do.     You do not get better in the time expected.     You have a hard time doing normal activities even with rest.     You have problems with your medicines.     You have any new symptoms.    MAKE SURE YOU:     Understand these instructions.     Will watch your condition.     Will get help right away if you are not doing well or get worse.    This information is not intended to replace advice given to you by your health care provider. Make sure you discuss any questions you have with your health care provider.    Document Released: 09/28/2007 Document Revised: 04/16/2013 Document Reviewed: 06/27/2011  Elsevier Interactive Patient Education ?2016 Elsevier Inc.

## 2019-07-21 NOTE — ED Notes (Signed)
ED Triage Note       ED Secondary Triage Entered On:  07/21/2019 20:41 EDT    Performed On:  07/21/2019 20:40 EDT by Earlene Plater, RN, Hermenia Bers               General Information   Barriers to Learning :   None evident   ED Home Meds Section :   Document assessment   [facility] ED Fall Risk Section :   Document assessment   ED Advance Directives Section :   Document assessment   ED Palliative Screen :   N/A (prefilled for <50yo)   Earlene Plater RN, Hermenia Bers - 07/21/2019 20:40 EDT   (As Of: 07/21/2019 20:41:10 EDT)   Diagnoses(Active)    Medical screening exam  Date:   07/21/2019 ; Diagnosis Type:   Reason For Visit ; Confirmation:   Complaint of ; Clinical Dx:   Medical screening exam ; Classification:   Medical ; Clinical Service:   Emergency medicine ; Code:   PNED ; Probability:   0 ; Diagnosis Code:   [id number]      Shortness of breath  Date:   07/21/2019 ; Diagnosis Type:   Discharge ; Confirmation:   Confirmed ; Clinical Dx:   Shortness of breath ; Classification:   Medical ; Clinical Service:   Non-Specified ; Code:   ICD-10-CM ; Probability:   0 ; Diagnosis Code:   R06.02             -    Procedure History   (As Of: 07/21/2019 20:41:10 EDT)     Phoebe Perch Fall Risk Assessment Tool   Hx of falling last 3 months ED Fall :   No   Patient confused or disoriented ED Fall :   No   Patient intoxicated or sedated ED Fall :   No   Patient impaired gait ED Fall :   No   Use a mobility assistance device ED Fall :   No   Patient altered elimination ED Fall :   No   [facility] ED Fall Score :   0    Earlene Plater RN, Hermenia Bers - 07/21/2019 20:40 EDT   ED Advance Directive   Advance Directive :   No   Earlene Plater RN, Johnna - 07/21/2019 20:40 EDT   Med Hx   Medication List   (As Of: 07/21/2019 20:41:11 EDT)

## 2019-07-21 NOTE — ED Notes (Signed)
ED Triage Note       ED Triage Adult Entered On:  07/21/2019 19:21 EDT    Performed On:  07/21/2019 19:13 EDT by Julien Girt, RN, Darcy A               Triage   Chief Complaint :   Pt states she thinks she was exposed to toxins and has since had episodes of hypoxia.    Numeric Rating Pain Scale :   0 = No pain   [place] Mode of Arrival :   Private vehicle   Infectious Disease Documentation :   Document assessment   Temperature Oral :   37.2 degC(Converted to: 99.0 degF)    Heart Rate Monitored :   101 bpm (HI)    Respiratory Rate :   18 br/min   Systolic Blood Pressure :   135 mmHg   Diastolic Blood Pressure :   91 mmHg (HI)    SpO2 :   100 %   Oxygen Therapy :   Room air   Patient presentation :   None of the above   Chief Complaint or Presentation suggest infection :   No   Weight Dosing :   64.2 kg(Converted to: 141 lb 9 oz)    Height :   165 cm(Converted to: 5 ft 5 in)    Body Mass Index Dosing :   24 kg/m2   Julien Girt Radiographer, therapeutic A - 07/21/2019 19:13 EDT   DCP GENERIC CODE   Tracking Acuity :   4   Tracking Group :   ED [employer]   [place], RN, Programme researcher, broadcasting/film/video A - 07/21/2019 19:13 EDT   ED General Section :   Document assessment   Pregnancy Status :   Patient denies   ED Allergies Section :   Document assessment   ED Reason for Visit Section :   Document assessment   ED Quick Assessment :   Patient appears awake, alert, oriented to baseline. Skin warm and dry. Moves all extremities. Respiration even and unlabored. Appears in no apparent distress.   Julien Girt, RN, Programme researcher, broadcasting/film/video A - 07/21/2019 19:13 EDT   ID Risk Screen Symptoms   Recent Travel History :   No recent travel   Close Contact with COVID-19 ID :   No   Last 14 days COVID-19 ID :   No   TB Symptom Screen :   No symptoms   C. diff Symptom/History ID :   Neither of the above   Julien Girt, RN, Programme researcher, broadcasting/film/video A - 07/21/2019 19:13 EDT   Allergies   (As Of: 07/21/2019 19:21:11 EDT)   Allergies (Active)   Levaquin  Estimated Onset Date:   Unspecified ; Reactions:   Hives ; Created By:    Julien Girt, RN, Darcy A; Reaction Status:   Active ; Category:   Drug ; Substance:   Levaquin ; Type:   Allergy ; Severity:   Unknown ; Updated By:   Julien Girt, RN, Clyde [place]; Reviewed Date:   07/21/2019 19:21 EDT        Psycho-Social   Last 3 mo, thoughts killing self/others :   Patient denies   ED Behavioral Activity Rating Scale :   4 - Quiet and awake (normal level of activity)   Julien Girt, RN, Clyde Canterbury - 07/21/2019 19:13 EDT   ED Reason for Visit   (As Of: 07/21/2019 19:21:11 EDT)   Diagnoses(Active)    Medical screening exam  Date:   07/21/2019 ; Diagnosis Type:  Reason For Visit ; Confirmation:   Complaint of ; Clinical Dx:   Medical screening exam ; Classification:   Medical ; Clinical Service:   Emergency medicine ; Code:   PNED ; Probability:   0 ; Diagnosis Code:   [id number]

## 2019-07-21 NOTE — Discharge Summary (Signed)
ED Clinical Summary                        [facility] [place] [address]  [phone]           PERSON INFORMATION  Name: Shelia Griffin, Shelia Griffin Age:  49 Years DOB: January 14, 1971   Sex: Female Language: English PCP: PCP,  NONE   Marital Status: Married Phone: [phone] Med Service: [facility]   MRN: [id number] Acct# [id number] Arrival: 07/21/2019 19:09:00   Visit Reason: Medical screening exam; LOW O2 LEVELS Acuity: 4 LOS: 000 02:16   Address:    [place] [address]   Diagnosis:    Shortness of breath  Medications:    Medications Administered During Visit:              Allergies      Levaquin (Hives)      Major Tests and Procedures:  The following procedures and tests were performed during your ED visit.  COMMON PROCEDURES%>  COMMON PROCEDURES COMMENTS%>                PROVIDER INFORMATION               Provider Role Assigned Benita Gutter M-MD ED Provider 07/21/2019 19:10:57    Juleen China RN, Josephina Gip ED Nurse 07/21/2019 19:22:08        Attending Physician:  Joslyn Devon M-MD      Admit Doc  Joslyn Devon M-MD     Consulting Doc       VITALS INFORMATION  Vital Sign Triage Latest   Temp Oral ORAL_1%> ORAL%>   Temp Temporal TEMPORAL_1%> TEMPORAL%>   Temp Intravascular INTRAVASCULAR_1%> INTRAVASCULAR%>   Temp Axillary AXILLARY_1%> AXILLARY%>   Temp Rectal RECTAL_1%> RECTAL%>   02 Sat 100 % 100 %   Respiratory Rate RATE_1%> RATE%>   Peripheral Pulse Rate PULSE RATE_1%>95 bpm PULSE RATE%>   Apical Heart Rate HEART RATE_1%> HEART RATE%>   Blood Pressure BLOOD PRESSURE_1%>/ BLOOD PRESSURE_1%>91 mmHg BLOOD PRESSURE%> / BLOOD PRESSURE%>70 mmHg                 Immunizations      No Immunizations Documented This Visit          DISCHARGE INFORMATION   Discharge Disposition: H Outpt-Sent Home   Discharge Location:  Home   Discharge Date and Time:  07/21/2019 21:25:00   ED Checkout Date and Time:  07/21/2019 21:25:00     DEPART REASON INCOMPLETE INFORMATION                Depart Action Incomplete Reason   Interactive View/I&O Recently assessed               Problems      No Problems Documented              Smoking Status      No Smoking Status Documented         PATIENT EDUCATION INFORMATION  Instructions:     Shortness of Breath, Easy-to-Read     Follow up:                   With: Address: When:   [place] [address]  [phone] Business (1) Within 1 week   Comments:   set up care with a primary care doctor. You had no hypoxia  while in the emergency department. There was no strain or dysrhythmia on your EKG. Please set up care with a primary care doctor for your ongoing issues. Your potassium today was 3.4 which is a normal level.              ED PROVIDER DOCUMENTATION

## 2019-07-21 NOTE — ED Notes (Signed)
ED Note-Nursing       ED RN Reassessment Entered On:  07/21/2019 20:46 EDT    Performed On:  07/21/2019 20:45 EDT by Earlene Plater RN, Hermenia Bers               ED RN Reassessment   ED Patient condition :   Alert, Condition unchanged   ED RN Progress Note :   pt walked to bathroom with portable sp02 monitor. pt sp02 remained above 92. pt ambulated without assistance   Earlene Plater, [id number] - 07/21/2019 20:45 EDT

## 2019-07-21 NOTE — ED Notes (Signed)
ED Patient Summary       ;       [facility] Emergency Department  [address] [place], [place] [address]  [phone]  Discharge Instructions (Patient)  _______________________________________     Name: Shelia Griffin, Shelia Griffin  DOB:  1971-04-23                   MRN: [id number]                   FIN: [id number]  Reason For Visit: Medical screening exam; LOW O2 LEVELS  Final Diagnosis: Shortness of breath     Visit Date: 07/21/2019 19:09:00  Address: [address] [place] [address]  Phone: [phone]     Emergency Department Providers:         Primary Physician:   Howie Ill [facility] would like to thank you for allowing [place] to assist you with your healthcare needs. The following includes patient education materials and information regarding your injury/illness.     Follow-up Instructions:  You were seen today on an emergency basis. Please contact your primary care doctor for a follow up appointment. If you received a referral to a specialist doctor, it is important you follow-up as instructed.    It is important that you call your follow-up doctor to schedule and confirm the location of your next appointment. Your doctor may practice at multiple locations. The office location of your follow-up appointment may be different to the one written on your discharge instructions.    If you do not have a primary care doctor, please call [phone] for help in finding a [facility] Provider. For help in finding a specialist doctor, please call [phone].    The [facility] "Ask a Nurse" line in staffed by Registered Nurses and is a free service to the community. We are available Monday - Friday from 8am to 5pm to answer your questions about your health. Please call [phone].    If your condition gets worse before your follow-up with your primary care doctor or specialist, please return to the Emergency Department.      Coronavirus 2019  (COVID-19) Reminders:     Patients aged 19 and older, people with increased risk for severe COVID-19 disease, or frontline workers with increased occupational risk can make an appointment for a COVID-19 vaccine. Patients can contact their [facility] doctors' offices to schedule an appointment to receive the COVID-19 vaccine at the [facility] or send [place] an email at [email or web address]. Patients who do not have a [facility] physician can call [phone] 727-DOCS to schedule vaccination appointments.            Scan this code with your phone camera to send an email to the address above.          Follow Up Appointments:  Primary Care Provider:      Name: PCP,  NONE      Phone:                  With: Address: When:   [facility] [address] [place], [place] [address]  [phone] Business (1) Within 1 week   Comments:   set up care with a primary care doctor. You had no hypoxia while in the emergency department. There was no strain or dysrhythmia on your EKG. Please set  up care with a primary care doctor for your ongoing issues. Your potassium today was 3.4 which is a normal level.              Printed Prescriptions:    Patient Education Materials:  Discharge Orders          Discharge Patient 07/21/19 20:55:00 EDT         Comment:      Shortness of Breath, Easy-to-Read     Shortness of Breath    Shortness of breath means you have trouble breathing. Shortness of breath needs medical care right away.      HOME CARE     Do not smoke.      Avoid being around chemicals or things (paint fumes, dust) that may bother your breathing.      Rest as needed. Slowly begin your normal activities.     Only take medicines as told by your doctor.     Keep all doctor visits as told.    GET HELP RIGHT AWAY IF:     Your shortness of breath gets worse.     You feel lightheaded, pass out (faint), or have a cough that is not helped by medicine.     You cough up blood.     You have  pain with breathing.     You have pain in your chest, arms, shoulders, or belly (abdomen).     You have a fever.     You cannot walk up stairs or exercise the way you normally do.     You do not get better in the time expected.     You have a hard time doing normal activities even with rest.     You have problems with your medicines.     You have any new symptoms.    MAKE SURE YOU:     Understand these instructions.     Will watch your condition.     Will get help right away if you are not doing well or get worse.    This information is not intended to replace advice given to you by your health care provider. Make sure you discuss any questions you have with your health care provider.    Document Released: 09/28/2007 Document Revised: 04/16/2013 Document Reviewed: 06/27/2011  Elsevier Interactive Patient Education ?2016 Elsevier Inc.         Allergy Info: Levaquin     Medication Information:  [facility] ED Physicians provided you with a complete list of medications post discharge, if you have been instructed to stop taking a medication please ensure you also follow up with this information to your Primary Care Physician.  Unless otherwise noted, patient will continue to take medications as prescribed prior to the Emergency Room visit.  Any specific questions regarding your chronic medications and dosages should be discussed with your physician(s) and pharmacist.          No Medications Documented      Medications Administered During Visit:       Major Tests and Procedures:  The following procedures and tests were performed during your ED visit.  COMMON PROCEDURES%>  COMMON PROCEDURES COMMENTS%>          Laboratory Orders  Name Status Details   BMP Completed Blood, Stat, ST - Stat, 07/21/19 20:12:00 EDT, 07/21/19 20:12:00 EDT, Nurse collect, POPE,  SABRINA M-MD, Print label Y/N   Joselyn Glassman Completed Blood, Stat, ST - Stat, Collected, 07/21/19 20:32:00 EDT [id number],  07/21/19 20:32:00 EDT, Nurse collect, Venous  Draw, 07/21/19 20:33:00 EDT, RH CP Login, POPE,  SABRINA M-MD, Print label Y/N, rh_laboratory_1, 1.8 mL FUXN/*[id number]*/, Complete   XTube Lav Completed Blood, Stat, ST - Stat, Collected, 07/21/19 20:32:00 EDT [id number], 07/21/19 20:32:00 EDT, Nurse collect, Venous Draw, 07/21/19 20:34:00 EDT, RH CP Login, POPE,  SABRINA M-MD, Print label Y/N, rh_laboratory_1, 4.5 mL CBJ/*[id number]*/, Complete               Radiology Orders  No radiology orders were placed.              Patient Care Orders  Name Status Details   Discharge Patient Ordered 07/21/19 20:55:00 EDT   ED Assessment Adult Completed 07/21/19 19:21:12 EDT, 07/21/19 19:21:12 EDT   ED Secondary Triage Completed 07/21/19 19:21:12 EDT, 07/21/19 19:21:12 EDT   ED Triage Adult Completed 07/21/19 19:10:11 EDT, 07/21/19 19:10:11 EDT       ---------------------------------------------------------------------------------------------------------------------  [facility]) encourages you to self-enroll in the [facility] Patient Portal.  [facility] Patient Portal will allow you to manage your personal health information securely from your own electronic device now and in the future.  To begin your Patient Portal enrollment process, please visit [email or web address] Click on "Sign up now" under [facility].  If you find that you need additional assistance on the [facility] Patient Portal or need a copy of your medical records, please call the [facility] Medical Records Office at [phone].  Comment:

## 2019-07-21 NOTE — ED Provider Notes (Signed)
General Medical Problem *ED        Patient:   Shelia Griffin, Shelia Griffin             MRN: [id number]            FIN: [id number]               Age:   49 years     Sex:  Female     DOB:  02/19/1971   Associated Diagnoses:   Shortness of breath; Feared condition not demonstrated   Author:   Howie Ill M-MD      Basic Information   Time seen: Provider Seen (ST)   ED Provider/Time:    Argel Pablo,  Martie Lee M-MD / 07/21/2019 19:10  .   Additional information: Chief Complaint from Nursing Triage Note   Chief Complaint  Chief Complaint: Pt states she thinks she was exposed to toxins and has since had episodes of hypoxia. (07/21/19 19:13:00).      History of Present Illness   49 year old female presents with chief complaint of hypoxia.  She voices concern for environmental poisoning.  She states 20 days ago she went to her apartment and had "sulfer" smell with resulting burning in her throat and lungs.  He notes since that time she has had episodes of shortness of breath.  She has a home pulse ox and said that it was 50 today.  She notes she has been to the hospital twice for similar symptoms and has had extensive work-up.  Patient notes today she was [place] the beach and had severe shortness of breath.  She notes she feels short of breath when she attempts to sing or yell.  Patient notes she has been taking oxygen drops to help with her symptoms.  She states she has had worsening fatigue and had an O2 sat in the 50's today. .        Review of Systems   Constitutional symptoms:  Generalized weakness, fatigue.    Skin symptoms:  Negative except as documented in HPI.   Eye symptoms:  Negative except as documented in HPI.   ENMT symptoms:  Negative except as documented in HPI.   Respiratory symptoms:  Shortness of breath.   Cardiovascular symptoms:  Chest pain.   Gastrointestinal symptoms:  Nausea.   Genitourinary symptoms:  Negative except as documented in HPI.   Musculoskeletal symptoms:  Muscle pain.   Neurologic symptoms:  Headache,  dizziness.    Hematologic/Lymphatic symptoms:  Negative except as documented in HPI.      Health Status   Allergies:    Allergic Reactions (All)  Unknown  Levaquin- Hives..   Medications:    Medications reviewed..      Past Medical/ Family/ Social History   Medical history: Reviewed as documented in chart.   Surgical history: Reviewed as documented in chart.   Family history: Not significant.   Social history: Reviewed as documented in chart.   Problem list: Per nurse's notes.      Physical Examination               Vital Signs   Vital Signs   07/21/2019 19:29 EDT Systolic Blood Pressure 135 mmHg    Diastolic Blood Pressure 91 mmHg  HI    Heart Rate Monitored 89 bpm    Respiratory Rate 20 br/min    SpO2 99 %   07/21/2019 19:13 EDT Systolic Blood Pressure 135 mmHg    Diastolic Blood Pressure 91 mmHg  HI  Temperature Oral 37.2 degC    Heart Rate Monitored 101 bpm  HI    Respiratory Rate 18 br/min    SpO2 100 %   .   Measurements   07/21/2019 19:21 EDT Body Mass Index est meas 23.58 kg/m2    Body Mass Index Measured 23.58 kg/m2   07/21/2019 19:13 EDT Height/Length Measured 165 cm    Weight Dosing 64.2 kg   .   Basic Oxygen Information   07/21/2019 19:29 EDT Oxygen Therapy Room air    SpO2 99 %   07/21/2019 19:13 EDT Oxygen Therapy Room air    SpO2 100 %   .   General:  Alert, mild distress.    Skin:  Warm, dry, intact.    Head:  Normocephalic, atraumatic.    Neck:  Supple, trachea midline.    Eye:  Extraocular movements are intact.   Ears, nose, mouth and throat:  Oral mucosa moist.   Cardiovascular:  Regular rate and rhythm.   Respiratory:  Respirations are non-labored, breath sounds are equal, Symmetrical chest wall expansion, no wheezing or rales .    Chest wall:  No tenderness, No deformity.    Back:  Normal range of motion.   Musculoskeletal:  Normal strength, no swelling.    Gastrointestinal:  Soft, Nontender, Non distended.    Neurological:  Normal sensory observed, normal motor observed, normal speech observed.     Psychiatric:  pressured speech, anxious .      Medical Decision Making   Rationale:  Presents with 20 days of shortness of breath and description of hypoxia however on arrival patient has oxygen saturation of 100%.  Patient speaks continuously for 3 minutes straight with pressured speech and no drop in her oxygen level. reassured of no hypoxia here. has already had extensive workup at other hospitals per patient. no current concern for emergent process.  believed underlying psychiatric component to patient's presentation.  .   Documents reviewed:  Emergency department nurses' notes.   Electrocardiogram:  Emergency Provider interpretation performed by me, Normal sinus rhythm, normal axis, ventricular rate 86.       Reexamination/ Reevaluation   Vital signs   Basic Oxygen Information   07/21/2019 19:29 EDT Oxygen Therapy Room air    SpO2 99 %   07/21/2019 19:13 EDT Oxygen Therapy Room air    SpO2 100 %      Course: unchanged.   Notes: patient ambulated by nurse with no drop in O2 sat, requesting her K be check. Labs WNL. directed to f/u with her PCP for further workup .      Impression and Plan   Diagnosis   Shortness of breath (ICD10-CM R06.02, Discharge, Medical)   Feared condition not demonstrated (ICD10-CM Z71.1, Discharge, Medical)   Plan   Condition: Stable.    Disposition: Discharged: to home.    Patient was given the following educational materials: Shortness of Breath, Easy-to-Read, Shortness of Breath, Easy-to-Read, Shortness of Breath, Easy-to-Read.    Follow up with: [facility] Within 1 week set up care with a primary care doctor, [facility] Within 1 week set up care with a primary care doctor.  You had no hypoxia while in the emergency department.  There was no strain or dysrhythmia on your EKG.  Please set up care with a primary care doctor for your ongoing issues, [facility] Within 1 week set up care with a primary care doctor.  You had no hypoxia while in the  emergency  department.  There was no strain or dysrhythmia on your EKG.  Please set up care with a primary care doctor for your ongoing issues.  Your potassium today was 3.4 which is a normal level..    Counseled: Patient, Regarding treatment plan.    Signature Line     Electronically Signed on 07/22/2019 11:19 AM EDT   ________________________________________________   Joslyn Devon M-MD               Modified by: Joslyn Devon M-MD on 07/21/2019 07:47 PM EDT      Modified by: Joslyn Devon M-MD on 07/21/2019 08:10 PM EDT      Modified by: Joslyn Devon M-MD on 07/21/2019 08:10 PM EDT      Modified by: Joslyn Devon M-MD on 07/21/2019 08:11 PM EDT      Modified by: Joslyn Devon M-MD on 07/21/2019 09:08 PM EDT      Modified by: Joslyn Devon M-MD on 07/22/2019 11:19 AM EDT

## 2019-07-22 LAB — BASIC METABOLIC PANEL
Anion Gap: 9 mmol/L (ref 2–17)
BUN: 9 mg/dL (ref 6–20)
CO2: 24 mmol/L (ref 22–29)
Calcium: 9 mg/dL (ref 8.6–10.0)
Chloride: 101 mmol/L (ref 98–107)
Creatinine: 0.6 mg/dL (ref 0.5–1.0)
GFR African American: 123 mL/min/{1.73_m2} (ref >=90)
GFR Non-African American: 106 mL/min/{1.73_m2} (ref >=90)
Glucose: 105 mg/dL — ABNORMAL HIGH (ref 70–99)
OSMOLALITY CALCULATED: 267 mOsm/kg — ABNORMAL LOW (ref 270–287)
Potassium: 3.6 mmol/L (ref 3.5–5.3)
Sodium: 134 mmol/L — ABNORMAL LOW (ref 135–145)

## 2019-07-26 ENCOUNTER — Emergency Department: Payer: Self-pay

## 2019-07-26 ENCOUNTER — Other Ambulatory Visit: Payer: Self-pay

## 2019-07-26 ENCOUNTER — Emergency Department
Admission: EM | Admit: 2019-07-26 | Discharge: 2019-07-26 | Disposition: A | Discharge status: Home / Self Care | Payer: Self-pay | Attending: Emergency Medicine | Admitting: Emergency Medicine

## 2019-07-26 ENCOUNTER — Ambulatory Visit: Payer: Self-pay

## 2019-07-26 ENCOUNTER — Ambulatory Visit (INDEPENDENT_AMBULATORY_CARE_PROVIDER_SITE_OTHER): Payer: Self-pay | Admitting: Nurse Practitioner

## 2019-07-26 VITALS — BP 103/71 | HR 92 | Temp 98.7°F | Ht 63.5 in | Wt 139.8 lb

## 2019-07-26 DIAGNOSIS — R0602 Shortness of breath: Secondary | ICD-10-CM

## 2019-07-26 DIAGNOSIS — Z8639 Personal history of other endocrine, nutritional and metabolic disease: Secondary | ICD-10-CM | POA: Insufficient documentation

## 2019-07-26 DIAGNOSIS — Z5321 Procedure and treatment not carried out due to patient leaving prior to being seen by health care provider: Secondary | ICD-10-CM | POA: Insufficient documentation

## 2019-07-26 DIAGNOSIS — R0789 Other chest pain: Secondary | ICD-10-CM | POA: Insufficient documentation

## 2019-07-26 DIAGNOSIS — R001 Bradycardia, unspecified: Secondary | ICD-10-CM | POA: Insufficient documentation

## 2019-07-26 LAB — CBC
HCT: 36.4 % (ref 36.0–46.0)
Hemoglobin: 12.3 g/dL (ref 12.0–15.0)
MCH: 29.3 pg (ref 26.0–34.0)
MCHC: 33.8 g/dL (ref 30.0–36.0)
MCV: 86.7 fL (ref 80.0–100.0)
Platelets: 274 10*3/uL (ref 150–400)
RBC: 4.2 MIL/uL (ref 3.87–5.11)
RDW: 14 % (ref 11.5–15.5)
WBC: 10.2 10*3/uL (ref 4.0–10.5)
nRBC: 0 % (ref 0.0–0.2)

## 2019-07-26 LAB — BASIC METABOLIC PANEL
Anion gap: 9 (ref 5–15)
BUN: 10 mg/dL (ref 6–20)
CO2: 23 mmol/L (ref 22–32)
Calcium: 9.4 mg/dL (ref 8.9–10.3)
Chloride: 104 mmol/L (ref 98–111)
Creatinine, Ser: 0.47 mg/dL (ref 0.44–1.00)
GFR calc Af Amer: >60 mL/min (ref >60)
GFR calc non Af Amer: >60 mL/min (ref >60)
Glucose, Bld: 93 mg/dL (ref 70–99)
Potassium: 3.8 mmol/L (ref 3.5–5.1)
Sodium: 136 mmol/L (ref 135–145)

## 2019-07-26 LAB — TROPONIN I (HIGH SENSITIVITY)
Troponin I (High Sensitivity): 2 ng/L (ref <18)
Troponin I (High Sensitivity): <2 ng/L (ref <18)

## 2019-07-26 MED ORDER — SODIUM CHLORIDE 0.9% FLUSH
3.0000 mL | Freq: Once | INTRAVENOUS | Status: AC
  Administered 2019-07-26: 3 mL via INTRAVENOUS

## 2019-07-26 NOTE — ED Notes (Signed)
Pt up to desk speaking with jenna, rn regarding wait and [place] turn around time. Pt repeatedly asking same questions and speaking rapidly to [place]. Pt states she is not going to wait any longer.pt informed by this RN unless another critical pt arrives there are two patients in front of her in lobby. Pt requesting address listed on chart to call a cab. Pt provided with pencil and paper and told address listed on chart. Pt shown to courtesy phone with number of cab listed on phone.

## 2019-07-26 NOTE — ED Notes (Signed)
Pt provided with address on her chart for transportation. Pt talking rapidly, asking about cab home. Pt informed of "how many are ahead" of her if no critical pt comes in. Pt states she does not want to stay. Pt's daughter in lobby to take pt home.

## 2019-07-26 NOTE — ED Notes (Signed)
Screener with patient talking at length about process of ER bedding and timing.

## 2019-07-26 NOTE — Progress Notes (Signed)
BP 103/71 (BP Location: Left Arm, Patient Position: Sitting, Cuff Size: Normal)   Pulse 92   Temp 98.7 F (37.1 C) (Oral)   Ht 5' 3.5" (1.613 m)   Wt 139 lb 12.8 oz (63.4 kg)   LMP 07/03/2019   SpO2 98%   BMI 24.38 kg/m    Subjective:    Patient ID: Isabel Meyers, female    DOB: [birth year], 49 y.o.   MRN: [id number]  HPI: Isabel Meyers is a 49 y.o. female presenting for new patient visit to establish care.  Introduced to Designer, jewellery role and practice setting.  All questions answered.  Chief Complaint  Patient presents with  . Establish Care  . Hypoxia    Oxygen was 65 yesterday. Ambulance came & O2 got up to 80. Patient states that she was exposed to enviromental factors.   . 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  She thinks she has been exposed to a new toxin at her new apartment and has since moved in with her daughter in the last day or so.    She states that for the past 4 weeks, she has been having shortness of breath episodes where her oxygen level drops.  She has tried to take it easier in terms of exercise and physical activity but it has not gotten better, only worse.  She also describes multiple other symptoms that she feels may or may not be affiliated with this shortness of breath.  She has been going to the ED and calling the ambulance frequently for this problem.  She called the ambulance yesterday and states that the shortness of breath and oxygenation episodes have been progressively getting worse.  Yesterday, she reports her oxygen saturation was in the mid 60s and after being placed on supplemental oxygen, her oxygen saturation increases back to normal.  She was admitted to the hospital recently for observation related to these symptoms and reports all of the testing was negative and that they did not discharge her with supplemental oxygen for home because her oxygen saturations  were stable in the hospital.   ABDOMINAL PAIN Patient describes bloating, loose, left mid abdominal pain. Duration: a month Diarrhea: yes ; describes as yellow-green stools, now back to brown.  Feels she has to release bowels first thing in the morning.  Never watery stools.  No blood in stool.  Episodes of diarrhea/day: 2-4 Nausea: yes Vomiting: no Episodes of vomit/day:  Abdominal pain: yes Fever: no Decreased appetite: no Tolerating liquids: yes Foreign travel: no Relevant dietary history: none Similar illness in contacts: no Recent antibiotic use: no Status: better  Allergies  Allergen Reactions  . Selsun Blue Dry Scalp [Pyrithione Zinc] Shortness Of Breath  . Levaquin [Levofloxacin] Other (See Comments)    "I almost died"  . Selenium Sulfide   . Salicylic Acid Rash   Outpatient Encounter Medications as of 07/26/2019  Medication Sig  . LORazepam (ATIVAN) 1 MG tablet Take 1 tablet by mouth 3 (three) times daily as needed.   No facility-administered encounter medications on file as of 07/26/2019.   Active Ambulatory Problems    Diagnosis Date Noted  . Chest tightness 07/13/2019  . Shortness of breath 07/16/2019  . History of thyroid nodule 07/26/2019   Resolved Ambulatory Problems    Diagnosis Date Noted  . No Resolved Ambulatory Problems   Past Medical History:  Diagnosis Date  . Anxiety   .  Irritable bowel    Past Medical History:  Diagnosis Date  . Anxiety   . Irritable bowel    Past Surgical History:  Procedure Laterality Date  . BREAST SURGERY    . NOSE SURGERY    . UTERINE FIBROID SURGERY     History reviewed. No pertinent family history.  Social History   Socioeconomic History  . Marital status: Unknown    Spouse name: Not on file  . Number of children: Not on file  . Years of education: Not on file  . Highest education level: Not on file  Occupational History  . Not on file  Tobacco Use  . Smoking status: Never Smoker  . Smokeless  tobacco: Never Used  Substance and Sexual Activity  . Alcohol use: Never  . Drug use: Never  . Sexual activity: Not on file  Other Topics Concern  . Not on file  Social History Narrative  . Not on file   Social Determinants of Health   Financial Resource Strain:   . Difficulty of Paying Living Expenses:   Food Insecurity:   . Worried About Programme researcher, broadcasting/film/video in the Last Year:   . Barista in the Last Year:   Transportation Needs:   . Freight forwarder (Medical):   Marland Kitchen Lack of Transportation (Non-Medical):   Physical Activity:   . Days of Exercise per Week:   . Minutes of Exercise per Session:   Stress:   . Feeling of Stress :   Social Connections:   . Frequency of Communication with Friends and Family:   . Frequency of Social Gatherings with Friends and Family:   . Attends Religious Services:   . Active Member of Clubs or Organizations:   . Attends Banker Meetings:   Marland Kitchen Marital Status:    Review of Systems  Constitutional: Positive for fatigue. Negative for activity change, appetite change and fever.  HENT: Negative.  Negative for congestion, ear discharge, ear pain, sinus pressure, sinus pain, sneezing and sore throat.   Eyes: Negative.   Respiratory: Positive for chest tightness (during episode) and shortness of breath (during episode). Negative for cough and wheezing.   Cardiovascular: Positive for chest pain (during episode) and palpitations (during episode). Negative for leg swelling.  Gastrointestinal: Positive for diarrhea. Negative for blood in stool, constipation, nausea and vomiting.  Genitourinary: Negative.  Negative for dysuria, flank pain, frequency and urgency.  Musculoskeletal: Negative.  Negative for back pain, gait problem, joint swelling, neck pain and neck stiffness.  Skin: Negative.  Negative for color change and pallor.  Neurological: Positive for dizziness (during episode) and light-headedness (during episode). Negative for  weakness, numbness and headaches.  Hematological: Negative.   Psychiatric/Behavioral: Negative for agitation, decreased concentration and sleep disturbance. The patient is nervous/anxious.    Per HPI unless specifically indicated above     Objective:    BP 103/71 (BP Location: Left Arm, Patient Position: Sitting, Cuff Size: Normal)   Pulse 92   Temp 98.7 F (37.1 C) (Oral)   Ht 5' 3.5" (1.613 m)   Wt 139 lb 12.8 oz (63.4 kg)   LMP 07/03/2019   SpO2 98%   BMI 24.38 kg/m   Wt Readings from Last 3 Encounters:  07/26/19 139 lb 12.8 oz (63.4 kg)  07/26/19 139 lb 12.8 oz (63.4 kg)  07/14/19 138 lb 12.8 oz (63 kg)    Physical Exam Vitals and nursing note reviewed.  Constitutional:      Appearance:  Normal appearance. She is not toxic-appearing.  HENT:     Head: Normocephalic and atraumatic.     Nose: Nose normal. No congestion.     Mouth/Throat:     Mouth: Mucous membranes are moist.     Pharynx: Oropharynx is clear.  Eyes:     General: No scleral icterus.    Extraocular Movements: Extraocular movements intact.     Pupils: Pupils are equal, round, and reactive to light.  Neck:     Thyroid: Thyromegaly present.  Cardiovascular:     Rate and Rhythm: Normal rate and regular rhythm.     Pulses: Normal pulses.     Heart sounds: No murmur.  Pulmonary:     Effort: Pulmonary effort is normal. No respiratory distress.     Breath sounds: Normal breath sounds. No wheezing or rhonchi.  Abdominal:     General: Abdomen is flat. Bowel sounds are normal. There is no distension.     Palpations: Abdomen is soft.     Tenderness: There is no abdominal tenderness.  Musculoskeletal:        General: Normal range of motion.     Cervical back: Normal range of motion. No rigidity.     Right lower leg: No edema.  Skin:    General: Skin is warm and dry.     Capillary Refill: Capillary refill takes less than 2 seconds.     Coloration: Skin is not jaundiced or pale.  Neurological:     General:  No focal deficit present.     Mental Status: She is alert and oriented to person, place, and time.     Motor: No weakness.     Gait: Gait normal.  Psychiatric:        Attention and Perception: Attention and perception normal.        Mood and Affect: Affect normal. Mood is anxious.        Speech: Speech is rapid and pressured.        Behavior: Behavior normal. Behavior is cooperative.        Thought Content: Thought content normal.        Cognition and Memory: Cognition and memory normal.        Judgment: Judgment normal.    Results for orders placed or performed in visit on 07/26/19  Pathologist smear review  Result Value Ref Range   Path Rev WBC Comment    Path Rev RBC WILL FOLLOW    Path Rev PLTs WILL FOLLOW    PATH INTERP BLD-IMP WILL FOLLOW    PATHOLOGIST NAME WILL FOLLOW    WBC 10.4 3.4 - 10.8 x10E3/uL   RBC 4.04 3.77 - 5.28 x10E6/uL   Hemoglobin 12.3 11.1 - 15.9 g/dL   Hematocrit 27.7 82.4 - 46.6 %   MCV 88 79 - 97 fL   MCH 30.4 26.6 - 33.0 pg   MCHC 34.7 31.5 - 35.7 g/dL   RDW 23.5 36.1 - 44.3 %   Platelets 255 150 - 450 x10E3/uL   Neutrophils 75 Not Estab. %   Lymphs 17 Not Estab. %   Monocytes 6 Not Estab. %   Eos 1 Not Estab. %   Basos 1 Not Estab. %   Neutrophils Absolute 7.9 (H) 1.4 - 7.0 x10E3/uL   Lymphocytes Absolute 1.7 0.7 - 3.1 x10E3/uL   Monocytes Absolute 0.6 0.1 - 0.9 x10E3/uL   EOS (ABSOLUTE) 0.1 0.0 - 0.4 x10E3/uL   Basophils Absolute 0.1 0.0 - 0.2 x10E3/uL   Immature  Granulocytes 0 Not Estab. %   Immature Grans (Abs) 0.0 0.0 - 0.1 x10E3/uL      Assessment & Plan:   Problem List Items Addressed This Visit      Other   Shortness of breath - Primary    Acute, ongoing.  Unclear etiology, previous workup completely negative and symptoms of rapid oxygen desaturation worrisome.  Unfortunately, does not medically qualify for supplemental oxygen at home.  Peripheral smear checked today.  May need to consider covid antibody testing in future.  Lung  sounds clear today.  Urgent referral to [facility] - pulmonology placed.  Encouraged to keep Cardiovascular appointment Monday, 07/29/2019.  Educated on [facility] in [place] if need to use in future.  Advised with any future shortness of breath or chest pain episodes, go to ED.      Relevant Orders   Pathologist smear review (Completed)   Referral to [facility]   Ambulatory referral to Pulmonology   History of thyroid nodule    Chronic, ongoing.  Lost to follow up for about 2 years.  Urgent referral placed to [facility] - Endocrinology.       Relevant Orders   Ambulatory referral to Endocrinology       Follow up plan: Return if symptoms worsen or fail to improve.   Time: 60 minutes, >50% spent counseling/or care coordination

## 2019-07-26 NOTE — Telephone Encounter (Signed)
Pt. And daughter calling to report they are in the car and pt. Had an episode of her heart rate in the 40's and O2 saturation in the 80's. Having chest pain as well. Saw her PCP today and is seeing a cardiologist Monday. Feels "a little better now." Instructed to go to ED now for evaluation. Verbalizes understanding.  Reason for Disposition . [1] MODERATE difficulty breathing (e.g., speaks in phrases, SOB even at rest, pulse 100-120) AND [2] NEW-onset or WORSE than normal  Answer Assessment - Initial Assessment Questions 1. RESPIRATORY STATUS: "Describe your breathing?" (e.g., wheezing, shortness of breath, unable to speak, severe coughing)      Shortness of breath 2. ONSET: "When did this breathing problem begin?"      Started 28 days ago 3. PATTERN "Does the difficult breathing come and go, or has it been constant since it started?"      Comes and goes 4. SEVERITY: "How bad is your breathing?" (e.g., mild, moderate, severe)    - MILD: No SOB at rest, mild SOB with walking, speaks normally in sentences, can lay down, no retractions, pulse < 100.    - MODERATE: SOB at rest, SOB with minimal exertion and prefers to sit, cannot lie down flat, speaks in phrases, mild retractions, audible wheezing, pulse 100-120.    - SEVERE: Very SOB at rest, speaks in single words, struggling to breathe, sitting hunched forward, retractions, pulse > 120      Moderate 5. RECURRENT SYMPTOM: "Have you had difficulty breathing before?" If so, ask: "When was the last time?" and "What happened that time?"      Yes 6. CARDIAC HISTORY: "Do you have any history of heart disease?" (e.g., heart attack, angina, bypass surgery, angioplasty)      No 7. LUNG HISTORY: "Do you have any history of lung disease?"  (e.g., pulmonary embolus, asthma, emphysema)     No 8. CAUSE: "What do you think is causing the breathing problem?"      Unsure 9. OTHER SYMPTOMS: "Do you have any other symptoms? (e.g., dizziness, runny nose, cough,  chest pain, fever)     Decreased O2 saturation - 80's 10. PREGNANCY: "Is there any chance you are pregnant?" "When was your last menstrual period?"       No 11. TRAVEL: "Have you traveled out of the country in the last month?" (e.g., travel history, exposures)       No  Protocols used: BREATHING DIFFICULTY-A-AH

## 2019-07-26 NOTE — ED Triage Notes (Addendum)
FIRST NURSE NOTE- pt here for chest pain. Per EMS sat low with fire dept but 100% with them. Pulled for EKG. Was seen at [facility] yesterday.  She had requested a carboxy get checked and was WNL there.  NAD. Unlabored.

## 2019-07-26 NOTE — ED Notes (Signed)
Pt ambulated with dorian NT 5 times  Back and forth down entire length of triage area with sats remaining 100% RA while ambulating.

## 2019-07-26 NOTE — ED Notes (Signed)
Patient up to stat desk to speak ask about wait times and lab results. This RN informed patient that only MD's could provide lab results as the meaning/interpretation of them had to be performed by an MD. Patient unsatisfied with this response.   RN informed patient that by wait time alone, patient was the third in line to go back, however, other factors could change this. Patient informed RN that she was 'sick of being lied to'. Patient further informed RN that the previous shift RN had told patient that there were only 6 patients ahead of her to go back, that she had been counting, and that 6 patient's had already gone back. This RN explained that while she is unaware of how many patient's had gone back before her, that there are multiple factors that can influence how patients are assigned to rooms. This RN further explained that there are both multiple waiting rooms and multiple areas of the emergency department that other patient's could be going to.  Patient then informed RN that she had seen a young girl who was "perfectly fine" in the main waiting room go back before her. This RN informed patient that she could not talk about other patients with her due to privacy laws, and again reinforced that there are multiple reasons why a person may be taken to a room before another such as lab results, scan results, and vital signs.   Patient continued to be  Unsatisfied with this RN's responses. April RN continued to speak with patient about her concerns.

## 2019-07-26 NOTE — ED Notes (Signed)
Pt up to phone to call daughter again.

## 2019-07-26 NOTE — ED Notes (Signed)
Pt up to first nurse desk reporting her sats on her home pulse ox are low.  Checked on ED equipment and 100% RA.  Pt remains unlabored and speaking in full sentences.  Pt just wants records of low oxygen so she can get oxygen at home.  Explained the importance of pleth and seeing it to know sats are accurate.  Will have NT ambulate pt with pulse ox in triage area.

## 2019-07-26 NOTE — ED Triage Notes (Addendum)
Pt on phone telling her daughter that she needs oxygen.   VSS here and with EMS.

## 2019-07-26 NOTE — ED Triage Notes (Signed)
Pt comes via EMS with c/o bradycardia and low O2. Pt states this has been happening and she is unsure what is going on.  Pt states yesterday her O2 was in 80s and placed on O2 to hospital. Pt states once arrived at hospital her O2 was improved. Pt states she didn't feel better. Pt states this is not anxiety.  Pt states episodes with BM and terrible stomach. Pt states weakness and hypotensive.  Pt states EMS caught her BP at 160s.  Pt states slight pain in left arm and little flutters that come and go.

## 2019-07-26 NOTE — ED Notes (Addendum)
This RN spoke at length with patient about lobby process in ED and waiting.  Pt speaking rapidly without difficulty. ambulatory and unlabored.   Explained happy to recheck sats if she feels they have dropped but have been 100% here and with EMS.  Also explained that unable to see EMS electronic records, especially from other counties.

## 2019-07-26 NOTE — ED Notes (Signed)
Pt requesting "administrator" name and phone number. Pt requesting phone number of president of hospital. Pt informed that do not have number for president at this time, but will provide name and number for administrative head of emergency department. Number and name of laura stanfield provided. Pt also requesting name of "check in" nurse that she saw earlier. Pt informed that we do not provide last names of staff but who encountered her will be on record of chart and administration will be able to access same.

## 2019-07-26 NOTE — ED Notes (Signed)
Pt wished to hold off on Chest Xray at this time.

## 2019-07-27 NOTE — Progress Notes (Signed)
Cardiology Office Note  Date:  07/29/2019   ID:  Isabel Meyers, DOB July 30, 1970, MRN [id number]  PCP:  Patient, No Pcp Per   Chief Complaint  Patient presents with  . New Patient (Initial Visit)    Pt states had a episode of nose and throat became dry/ burning, O2 dropping to 70's, body smells, BM (yellow, green), fatigue, BP going up and down, other symtoms stated. Meds verbally reviewed w/ pt.    HPI:  Mr. Isabel Meyers is a 49 year old woman with past medical history of Anxiety Self-referred for shortness of breath, hypoxia, bradycardia, weakness  Numerous trips to the emergency room March and April Reports getting low numbers on her pulse oximeter causing her to go to the emergency room Emergency room numbers are always normal  Seen in the emergency room July 25, 2019 [facility]  Notes from emergency room indicate 1 month of hypoxia taken with her own pulse oximeter Seen multiple times in the emergency room for similar symptoms negative PVLs (07/07/19) and CTA chest (07/13/19) for DVT/PE, and normal echo (07/07/19).  She reported hypoxic in the 50s with a HR in the 40s.   She was given Ativan for anxiety [name] new, EKG normal, D-dimer negative, with negative Felt symptoms secondary to anxiety/panic attacks, was discharged with Ativan Saturations in the emergency room 99% Was felt she might need a Holter monitor  "Been sick 30 days, weak" Staying with daughter Having "Instant drops in heart rate and oxygen level" Hands go "ice cold" Can't talk "Then heart rate goes to 170 bpm" Fire dept registered her at "50" oxygen Stomach problems, possible gastroenteritis, gas Involuntary jerking, spasms  Used to walk 2 hours a day  EKG personally reviewed by myself on todays visit NSR rate 81 bpm, no significant ST or T wave changes  Ambulated around the office today, sats maintained 97%  Echo 07/07/2019  Global left ventricular wall motion and contractility  are within normal limits. 3. The EF is estimated at 55-60%. 4. There is mild mitral regurgitation observed. 5. No pulmonary hypertension is noted. 6. There is no pericardial effusion.   PMH:   has a past medical history of Anxiety, Irritable bowel, and Thyroid disease.  PSH:    Past Surgical History:  Procedure Laterality Date  . BREAST SURGERY    . NOSE SURGERY    . UTERINE FIBROID SURGERY      No current outpatient medications on file.   No current facility-administered medications for this visit.     Allergies:   Selsun blue dry scalp [pyrithione zinc], Levaquin [levofloxacin], Selenium sulfide, and Salicylic acid   Social History:  The patient  reports that she has never smoked. She has never used smokeless tobacco. She reports that she does not drink alcohol or use drugs.   Family History:   family history includes Atrial fibrillation in her father; Clotting disorder in her mother; Mitral valve prolapse in her sister.    Review of Systems: Review of Systems  Constitutional: Negative.        Weak  HENT: Negative.   Respiratory: Negative.   Cardiovascular: Negative.   Gastrointestinal: Negative.   Musculoskeletal: Negative.   Neurological: Positive for dizziness.  Psychiatric/Behavioral: Negative.   All other systems reviewed and are negative.   PHYSICAL EXAM: VS:  BP 96/64 (BP Location: Right Arm, Patient Position: Sitting, Cuff Size: Normal)   Pulse 81   Ht 5' 3.5" (1.613 m)   Wt 139 lb 4 oz (63.2 kg)  LMP 07/03/2019   SpO2 98%   BMI 24.28 kg/m  , BMI Body mass index is 24.28 kg/m. GEN: Well nourished, well developed, in no acute distress HEENT: normal Neck: no JVD, carotid bruits, or masses Cardiac: RRR; no murmurs, rubs, or gallops,no edema  Respiratory:  clear to auscultation bilaterally, normal work of breathing GI: soft, nontender, nondistended, + BS MS: no deformity or atrophy Skin: warm and dry, no rash Neuro:  Strength and sensation are  intact Psych: euthymic mood, full affect  Recent Labs: 07/13/2019: ALT 17; B Natriuretic Peptide 30.0; TSH 0.674 07/26/2019: BUN 10; Creatinine, Ser 0.47; Hemoglobin 12.3; Platelets 274; Potassium 3.8; Sodium 136    Lipid Panel Lab Results  Component Value Date   CHOL 159 07/14/2019   HDL 69 07/14/2019   LDLCALC 83 07/14/2019   TRIG 35 07/14/2019      Wt Readings from Last 3 Encounters:  07/29/19 139 lb 4 oz (63.2 kg)  07/26/19 139 lb 12.8 oz (63.4 kg)  07/26/19 139 lb 12.8 oz (63.4 kg)      ASSESSMENT AND PLAN:  Problem List Items Addressed This Visit    None    Visit Diagnoses    Bradycardia    -  Primary   Hypoxia       Weakness         Bradycardia Self-reported bradycardic episodes, no telemetry strips or EKG to document Etiology unclear , seems to come from her pulse oximeter zio monitor has been ordered, will correlate symptoms of dizziness, hypoxia to rhythm.  Hypoxia No clear documentation in the emergency room, seems to come episodically per [name] sats maintaining 97% walking in the office No prior smoking, no COPD, no fibrosis zio monitor has been ordered, correlate hypoxia on monitor to her ZIO  Disposition:   F/U as needed We will call with zio results    Total encounter time more than 45 minutes  Greater than 50% was spent in counseling and coordination of care with the patient    Signed, Esmond Plants, M.D., Ph.D. [place], [place]

## 2019-07-29 ENCOUNTER — Encounter: Payer: Self-pay | Admitting: Cardiovascular Disease

## 2019-07-29 ENCOUNTER — Telehealth: Payer: Self-pay | Admitting: Nurse Practitioner

## 2019-07-29 ENCOUNTER — Encounter: Payer: Self-pay | Admitting: Nurse Practitioner

## 2019-07-29 ENCOUNTER — Ambulatory Visit (INDEPENDENT_AMBULATORY_CARE_PROVIDER_SITE_OTHER): Payer: Self-pay

## 2019-07-29 ENCOUNTER — Other Ambulatory Visit: Payer: Self-pay

## 2019-07-29 ENCOUNTER — Ambulatory Visit (INDEPENDENT_AMBULATORY_CARE_PROVIDER_SITE_OTHER): Payer: Self-pay | Admitting: Cardiovascular Disease

## 2019-07-29 VITALS — BP 96/64 | HR 81 | Ht 63.5 in | Wt 139.2 lb

## 2019-07-29 DIAGNOSIS — R001 Bradycardia, unspecified: Secondary | ICD-10-CM

## 2019-07-29 DIAGNOSIS — R0902 Hypoxemia: Secondary | ICD-10-CM

## 2019-07-29 DIAGNOSIS — R531 Weakness: Secondary | ICD-10-CM

## 2019-07-29 DIAGNOSIS — R42 Dizziness and giddiness: Secondary | ICD-10-CM

## 2019-07-29 NOTE — Telephone Encounter (Signed)
Agree with triage disposition.

## 2019-07-29 NOTE — Patient Instructions (Addendum)
Zio monitor for bradycardia, hypoxia  Medication Instructions:  No changes  If you need a refill on your cardiac medications before your next appointment, please call your pharmacy.    Lab work: No new labs needed   If you have labs (blood work) drawn today and your tests are completely normal, you will receive your results only by: Marland Kitchen MyChart Message (if you have MyChart) OR . A paper copy in the mail If you have any lab test that is abnormal or we need to change your treatment, we will call you to review the results.   Testing/Procedures: Your physician has recommended that you wear a Zio monitor. This monitor is a medical device that records the heart's electrical activity. Doctors most often use these monitors to diagnose arrhythmias. Arrhythmias are problems with the speed or rhythm of the heartbeat. The monitor is a small device applied to your chest. You can wear one while you do your normal daily activities. While wearing this monitor if you have any symptoms to push the button and record what you felt. Once you have worn this monitor for the period of time provider prescribed (Usually 14 days), you will return the monitor device in the postage paid box. Once it is returned they will download the data collected and provide [place] with a report which the provider will then review and we will call you with those results. Important tips:  1. Avoid showering during the first 24 hours of wearing the monitor. 2. Avoid excessive sweating to help maximize wear time. 3. Do not submerge the device, no hot tubs, and no swimming pools. 4. Keep any lotions or oils away from the patch. 5. After 24 hours you may shower with the patch on. Take brief showers with your back facing the shower head.  6. Do not remove patch once it has been placed because that will interrupt data and decrease adhesive wear time. 7. Push the button when you have any symptoms and write down what you were feeling. 8. Once you  have completed wearing your monitor, remove and place into box which has postage paid and place in your outgoing mailbox.  9. If for some reason you have misplaced your box then call our office and we can provide another box and/or mail it off for you.        Follow-Up: At [facility], you and your health needs are our priority.  As part of our continuing mission to provide you with exceptional heart care, we have created designated Provider Care Teams.  These Care Teams include your primary Cardiologist (physician) and Advanced Practice Providers (APPs -  Physician Assistants and Nurse Practitioners) who all work together to provide you with the care you need, when you need it.  . You will need a follow up appointment as needed  . Providers on your designated Care Team:   . Nicolasa Ducking, NP . Eula Listen, PA-C . Marisue Ivan, PA-C  Any Other Special Instructions Will Be Listed Below (If Applicable).  For educational health videos Log in to : [email or web address] Or : [email or web address], password : [email or web address]

## 2019-07-29 NOTE — Assessment & Plan Note (Addendum)
Chronic, ongoing.  Lost to follow up for about 2 years.  Urgent referral placed to [facility] - Endocrinology.

## 2019-07-29 NOTE — Assessment & Plan Note (Addendum)
Acute, ongoing.  Unclear etiology, previous workup completely negative and symptoms of rapid oxygen desaturation worrisome.  Unfortunately, does not medically qualify for supplemental oxygen at home.  Peripheral smear checked today.  May need to consider covid antibody testing in future.  Lung sounds clear today.  Urgent referral to [facility] - pulmonology placed.  Encouraged to keep Cardiovascular appointment Monday, 07/29/2019.  Educated on free [facility] in [place] if need to use in future.  Advised with any future shortness of breath or chest pain episodes, go to ED.

## 2019-07-29 NOTE — Telephone Encounter (Signed)
Pt states that Denzil Magnuson had given her a referral for her thyroid and sent to [facility] pt now states that her daughter thinks it would be better to do someone in [facility] and close to home. Please do another referral as local as soon as possible.P t wants FU asap

## 2019-07-30 ENCOUNTER — Telehealth: Payer: Self-pay | Admitting: Cardiovascular Disease

## 2019-07-30 NOTE — Telephone Encounter (Signed)
Spoke with patient and she called in to review monitor is in place and she did have an episode today where her oxygen levels dropped. She wanted to know if it was OK to use oxygen for these episodes. Reviewed that we really need to see the monitor results before we can determine what is causing these issues. Reviewed that an abnormal reading one minute may be normal in two minutes. Discussed that the results can give a clearer picture. She also mentioned that oxygen levels have been as low as 50 and reviewed that once we review monitor pulmonary referral may be useful to evaluate in more detail. After lengthy discussion she verbalized understanding that we will wait for monitor then schedule follow up if needed. She was agreeable with plan with no further questions at this time.

## 2019-07-30 NOTE — Telephone Encounter (Signed)
Patient's daughter would like the doctor to call regarding a referral that was sent to [facility].  Daughter said that her mother is not able to travel that far and would like a referral in [place].  Please call to discuss at [phone]

## 2019-07-30 NOTE — Telephone Encounter (Signed)
Patient had a monitor placed and stated yesterday was a great day. Patient said she had a HR episode today (raising and lowering for 1.5 hour). Patient wrote all this down in the book that goes to [place] and recorded it. Patient wants to know if she needs to get another booklet or if she can just write on additional paper and add it in to the box.  Patient also forgot to mention to Dr. Mariah Milling that she had been diagnosed with an ectatic ascending aorta. It has been 5 years since this was found on a CT scan of the heart and would like to check that. Patient used to have stomach issues and this has resurfaced in the past 30 days.    Please advise

## 2019-07-31 ENCOUNTER — Telehealth: Payer: Self-pay | Admitting: Nurse Practitioner

## 2019-07-31 ENCOUNTER — Ambulatory Visit: Payer: Self-pay | Admitting: General Practice

## 2019-07-31 DIAGNOSIS — R0602 Shortness of breath: Secondary | ICD-10-CM

## 2019-07-31 DIAGNOSIS — Z8639 Personal history of other endocrine, nutritional and metabolic disease: Secondary | ICD-10-CM

## 2019-07-31 NOTE — Telephone Encounter (Signed)
Called pt and let her know that urgent referrals have been place. Pt verbalized understanding

## 2019-07-31 NOTE — Telephone Encounter (Signed)
Pt called in stating that Shanda Bumps had put in a referral for [facility], however it is a long wait time. Pt would like referrals for endocrinologist and pulmonary sent to somewhere local so that she can possibly be seen soon. Pt states that there was also miscommunication when she called in previously

## 2019-07-31 NOTE — Telephone Encounter (Signed)
Patient notified of referrals.

## 2019-07-31 NOTE — Telephone Encounter (Signed)
Pt called in stating that Jessica had put in a referral for [facility], however it is a long wait time. Pt would like referrals for endocrinologist and pulmonary sent to somewhere local so that she can possibly be seen soon. Pt states that there was also miscommunication when she called in previously 

## 2019-07-31 NOTE — Telephone Encounter (Signed)
See previous encounter, referrals re-routed to whoever can see patient soonest.

## 2019-07-31 NOTE — Telephone Encounter (Signed)
Urgent referrals placed for Endocrine and Pulmonology.  Specifically asked for whoever can see soonest.

## 2019-07-31 NOTE — Chronic Care Management (AMB) (Signed)
  Chronic Care Management   Outreach Note  07/31/2019 Name: Isabel Meyers MRN: [id number] DOB: 03/25/1971  Referred by: Wells Guiles, NP Reason for referral : Care Coordination (MD Referral: Care management needs and care coordination- attempt)   An unsuccessful telephone outreach was attempted today. The patient was referred to the case management team for assistance with care management and care coordination.   Follow Up Plan: The care management team will reach out to the patient again over the next 7 to 14  days.   Alto Denver RN, MSN, CCM Community Care Coordinator [place]  [facility] Mobile: [phone]

## 2019-08-01 ENCOUNTER — Telehealth: Payer: Self-pay | Admitting: Pulmonary Disease

## 2019-08-01 LAB — PATHOLOGIST SMEAR REVIEW
Basophils Absolute: 0.1 10*3/uL (ref 0.0–0.2)
Basos: 1 %
EOS (ABSOLUTE): 0.1 10*3/uL (ref 0.0–0.4)
Eos: 1 %
Hematocrit: 35.4 % (ref 34.0–46.6)
Hemoglobin: 12.3 g/dL (ref 11.1–15.9)
Immature Grans (Abs): 0 10*3/uL (ref 0.0–0.1)
Immature Granulocytes: 0 %
Lymphocytes Absolute: 1.7 10*3/uL (ref 0.7–3.1)
Lymphs: 17 %
MCH: 30.4 pg (ref 26.6–33.0)
MCHC: 34.7 g/dL (ref 31.5–35.7)
MCV: 88 fL (ref 79–97)
Monocytes Absolute: 0.6 10*3/uL (ref 0.1–0.9)
Monocytes: 6 %
Neutrophils Absolute: 7.9 10*3/uL — ABNORMAL HIGH (ref 1.4–7.0)
Neutrophils: 75 %
Path Rev PLTs: NORMAL
Path Rev RBC: NORMAL
Platelets: 255 10*3/uL (ref 150–450)
RBC: 4.04 x10E6/uL (ref 3.77–5.28)
RDW: 13.1 % (ref 11.7–15.4)
WBC: 10.4 10*3/uL (ref 3.4–10.8)

## 2019-08-02 ENCOUNTER — Telehealth: Payer: Self-pay | Admitting: Nurse Practitioner

## 2019-08-02 ENCOUNTER — Other Ambulatory Visit: Payer: Self-pay

## 2019-08-02 ENCOUNTER — Ambulatory Visit: Payer: Self-pay | Admitting: Pulmonary Disease

## 2019-08-02 ENCOUNTER — Ambulatory Visit: Payer: Self-pay

## 2019-08-02 ENCOUNTER — Encounter: Payer: Self-pay | Admitting: Pulmonary Disease

## 2019-08-02 VITALS — BP 114/68 | HR 92 | Ht 62.5 in | Wt 140.6 lb

## 2019-08-02 DIAGNOSIS — R0602 Shortness of breath: Secondary | ICD-10-CM

## 2019-08-02 DIAGNOSIS — R0902 Hypoxemia: Secondary | ICD-10-CM

## 2019-08-02 NOTE — Telephone Encounter (Signed)
Noted  

## 2019-08-02 NOTE — Telephone Encounter (Signed)
Pt had OV with Dr. Jayme Cloud today 4/9. Nothing further needed.

## 2019-08-02 NOTE — Patient Instructions (Signed)
We are going to get breathing tests and a cardiopulmonary stress test this will have to be done in [place].  We will see you in follow-up after those tests are done.

## 2019-08-02 NOTE — Telephone Encounter (Signed)
Sam, With [facility] genetics department, calling to state they received Endo referral. He is cancelling it out so it can be resent to the correct department.

## 2019-08-02 NOTE — Telephone Encounter (Signed)
Called pt scheduled her for 09/02/19

## 2019-08-02 NOTE — Chronic Care Management (AMB) (Signed)
  Care Management   Follow Up Note   08/02/2019 Name: Isabel Meyers MRN: [id number] DOB: August 03, 1970  Referred by: Wells Guiles, NP Reason for referral : Care Coordination   Isabel Meyers is a 49 y.o. year old female who is a primary care patient of [name] I, NP. The care management team was consulted for assistance with care management and care coordination needs.    Review of patient status, including review of consultants reports, relevant laboratory and other test results, and collaboration with appropriate care team members and the patient's provider was performed as part of comprehensive patient evaluation and provision of chronic care management services.    LCSW completed CCM outreach attempt today but was unable to reach patient successfully. A HIPPA compliant voice message was left encouraging patient to return call once available. LCSW rescheduled CCM SW appointment as well.  A HIPPA compliant phone message was left for the patient providing contact information and requesting a return call.   Dickie La, BSW, MSW, LCSW [facility] Care Management [place]  [facility] [email or web address] Phone: [phone]

## 2019-08-09 ENCOUNTER — Ambulatory Visit: Payer: Self-pay | Admitting: General Practice

## 2019-08-09 ENCOUNTER — Telehealth: Payer: Self-pay

## 2019-08-09 NOTE — Chronic Care Management (AMB) (Signed)
°  Chronic Care Management   Outreach Note  08/09/2019 Name: Isabel Meyers MRN: [id number] DOB: [birth year]  Referred by: Wells Guiles, NP Reason for referral : Care Coordination (Initial- 2nd attempt: Care Management needs and Chronic disease managment )   A second unsuccessful telephone outreach was attempted today. The patient was referred to the case management team for assistance with care management and care coordination.   Follow Up Plan: The care management team will reach out to the patient again over the next 30 to 60  days.   Alto Denver RN, MSN, CCM Community Care Coordinator [place]   [facility] Mobile: [phone]

## 2019-08-14 ENCOUNTER — Ambulatory Visit: Payer: Self-pay | Admitting: General Practice

## 2019-08-14 NOTE — Chronic Care Management (AMB) (Signed)
°  Chronic Care Management   Outreach Note  08/14/2019 Name: Isabel Meyers MRN: [id number] DOB: 1971/03/04  Referred by: Wells Guiles, NP Reason for referral : Care Coordination (3rd attempt: Initial with care coordination needs and education related to chronic disease management)   Third unsuccessful telephone outreach was attempted today. The patient was referred to the case management team for assistance with care management and care coordination. The patient's primary care provider has been notified of our unsuccessful attempts to make or maintain contact with the patient. The care management team is pleased to engage with this patient at any time in the future should he/she be interested in assistance from the care management team.   Follow Up Plan: The care management team is available to follow up with the patient after provider conversation with the patient regarding recommendation for care management engagement and subsequent re-referral to the care management team.   Alto Denver RN, MSN, CCM Community Care Coordinator [place]   [facility] Mobile: [phone]

## 2019-08-16 ENCOUNTER — Telehealth: Payer: Self-pay | Admitting: Nurse Practitioner

## 2019-08-16 NOTE — Telephone Encounter (Signed)
Called to speak with pt about the requested labs, no answer on either phone and unable to LVM. Will try to call back.   Copied from CRM [id number]. Topic: General - Call Back - No Documentation >> Aug 15, 2019  1:58 PM Randol Kern wrote: Pt would like a blood culture test and also a throat swab test for bacteria  Best contact: [phone]

## 2019-08-19 ENCOUNTER — Telehealth (INDEPENDENT_AMBULATORY_CARE_PROVIDER_SITE_OTHER): Payer: Self-pay | Admitting: Nurse Practitioner

## 2019-08-19 ENCOUNTER — Encounter: Payer: Self-pay | Admitting: Nurse Practitioner

## 2019-08-19 DIAGNOSIS — R195 Other fecal abnormalities: Secondary | ICD-10-CM

## 2019-08-19 NOTE — Progress Notes (Signed)
There were no vitals taken for this visit.   Subjective:    Patient ID: Isabel Meyers, female    DOB: 1970/09/29, 49 y.o.   MRN: [id number]  HPI: Isabel Meyers is a 49 y.o. female presenting for follow up.  Chief Complaint  Patient presents with  . Follow-up    pt states she wants to just follow up with Isabel Meyers, states she was told she may have a mitral valve prolapse. States her doctors really havent helped her any. States she started taking something for bacteria and is feeling a little better since then. Wants to know if we can check for bacteria other ways   Patient has seen multiple specialists and states that overall, she is having fewer episodes but is not feeling a whole lot better.  Recently, she reports having one minor episode where her oxygen drops and heart rate increases and reports not having any serious episodes recently.  After this last episode, she had nausea and very loose stools after.  The cardiologist did a vio monitor test and these results should be back soon.  She is scheduled to have a pulmonary cardiac stress test on May 3rd and reports that the pulmonologist told her she may have a mitral valve prolapse.     Patient is concerned today about the recent change in her bowel movements.  She reports an urgency every morning to have a bowel movement that is relieved after she uses the bathroom.  She has changed her diet and has been consistently going to a chiropractor.  She also started taking oil of oregano and olive leaf and she noticed that she went about 1 week without any episodes.  BOWEL MOVEMENT CHANGES Patient states that her stools have varied greatly.  When the stool is very acidic, she notices some blood on the toilet paper, otherwise no blood in her bowel movements.  She reports she has a lot of burping, gas, and flatulence after she eats.  Her stools range in texture and size and they are not consistent or normal.  Of note, she reports having an  allergist in [facility] who told her the her VIP plasma blood test is high in the past.  She acknowledges that she probably needs a colonoscopy but does not think her body could handle the prep for it at this time. Duration: weeks Diarrhea: yes non-bloody  Episodes of diarrhea/day: once in morning and 1-2 times in evening  Nausea: no  Vomiting: no Episodes of vomit/day: 0 Abdominal pain: no Fever: no Decreased appetite: no Tolerating liquids: yes Foreign travel: no Relevant dietary history: none Similar illness in contacts: no Recent antibiotic use: no  Red cheeks: yes Status: fluctuating Treatments attempted: Previously took cultured yogurt and is going to start a probiotic today.  Has also changed her diet drastically to include greens and is juicing.   Allergies  Allergen Reactions  . Selsun Blue Dry Scalp [Pyrithione Zinc] Shortness Of Breath  . Levaquin [Levofloxacin] Other (See Comments)    "I almost died"  . Selenium Sulfide   . Salicylic Acid Rash   No outpatient encounter medications on file as of 08/19/2019.   No facility-administered encounter medications on file as of 08/19/2019.   Patient Active Problem List   Diagnosis Date Noted  . Abnormal stool caliber 08/20/2019  . History of thyroid nodule 07/26/2019  . Shortness of breath 07/16/2019  . Chest tightness 07/13/2019   Past Medical History:  Diagnosis Date  . Anxiety   .  Irritable bowel   . Thyroid disease    Relevant past medical, surgical, family and social history reviewed and updated as indicated. Interim medical history since our last visit reviewed.  Review of Systems  Constitutional: Positive for fatigue. Negative for activity change, appetite change and fever.  Respiratory: Negative.  Negative for cough, shortness of breath and wheezing.   Cardiovascular: Negative.  Negative for chest pain and palpitations.  Gastrointestinal: Positive for abdominal distention and diarrhea. Negative for  abdominal pain, blood in stool, constipation, nausea and vomiting.  Musculoskeletal: Negative.   Skin: Negative.   Neurological: Negative.   Hematological: Negative.   Psychiatric/Behavioral: Negative.    Per HPI unless specifically indicated above     Objective:    There were no vitals taken for this visit.  Wt Readings from Last 3 Encounters:  08/20/19 140 lb (63.5 kg)  08/02/19 140 lb 9.6 oz (63.8 kg)  07/29/19 139 lb 4 oz (63.2 kg)    Physical Exam Constitutional:      General: She is not in acute distress.    Appearance: Normal appearance. She is not toxic-appearing.  HENT:     Mouth/Throat:     Mouth: Mucous membranes are moist.     Pharynx: Oropharynx is clear. No oropharyngeal exudate or posterior oropharyngeal erythema.  Eyes:     General: No scleral icterus.    Extraocular Movements: Extraocular movements intact.  Cardiovascular:     Comments: Unable to assess via virtual visit Pulmonary:     Effort: Pulmonary effort is normal. No respiratory distress.     Comments: Unable to assess lung sounds due to virtual visit Abdominal:     Comments: Unable to assess bowel sounds due to virtual visit  Musculoskeletal:     Cervical back: Normal range of motion.  Skin:    Coloration: Skin is not jaundiced or pale.  Neurological:     General: No focal deficit present.     Mental Status: She is alert and oriented to person, place, and time.     Motor: No weakness.     Gait: Gait normal.  Psychiatric:        Mood and Affect: Mood normal.        Behavior: Behavior normal.        Judgment: Judgment normal.       Assessment & Plan:   Problem List Items Addressed This Visit      Other   Abnormal stool caliber - Primary    Acute, ongoing.  Given abnormalities of stools, will test for C. Diff, ova and parasites, and stool culture.  Patient given instructions on collection and advised to return sample to clinic.  Follow up as scheduled or sooner if acute needs arise.       Relevant Orders   Cdiff NAA+O+P+Stool Culture       Follow up plan: Return as scheduled.  Due to the catastrophic nature of the COVID-19 pandemic, this visit was completed via audio and visual contact via Mychart due to the restrictions of the COVID-19 pandemic. All issues as above were discussed and addressed. Physical exam was done as above through visual confirmation on Mychart. If it was felt that the patient should be evaluated in the office, they were directed there. The patient verbally consented to this visit."} . Location of the patient: home . Location of the provider: work . Those involved with this call:  . Provider: [name], DNP . CMA: Wilhemena Durie, CMA . Front Desk/Registration: Radio producer  Maricela Bo  . Time spent on call: 22 minutes on the phone discussing health concerns. 30 minutes total spent in review of patient's record and preparation of their chart.  I verified patient identity using two factors (patient name and date of birth). Patient consents verbally to being seen via telemedicine visit today.

## 2019-08-19 NOTE — Telephone Encounter (Signed)
LVM for pt to call back.

## 2019-08-20 ENCOUNTER — Encounter: Payer: Self-pay | Admitting: Emergency Medicine

## 2019-08-20 ENCOUNTER — Other Ambulatory Visit: Payer: Self-pay

## 2019-08-20 ENCOUNTER — Telehealth: Payer: Self-pay | Admitting: Pulmonary Disease

## 2019-08-20 ENCOUNTER — Emergency Department
Admission: EM | Admit: 2019-08-20 | Discharge: 2019-08-20 | Disposition: A | Discharge status: Home / Self Care | Payer: Self-pay | Attending: Student | Admitting: Student

## 2019-08-20 DIAGNOSIS — Z0000 Encounter for general adult medical examination without abnormal findings: Secondary | ICD-10-CM

## 2019-08-20 DIAGNOSIS — R110 Nausea: Secondary | ICD-10-CM | POA: Insufficient documentation

## 2019-08-20 DIAGNOSIS — R195 Other fecal abnormalities: Secondary | ICD-10-CM | POA: Insufficient documentation

## 2019-08-20 LAB — CBC WITH DIFFERENTIAL/PLATELET
Abs Immature Granulocytes: 0.04 10*3/uL (ref 0.00–0.07)
Basophils Absolute: 0.1 10*3/uL (ref 0.0–0.1)
Basophils Relative: 1 %
Eosinophils Absolute: 0.1 10*3/uL (ref 0.0–0.5)
Eosinophils Relative: 1 %
HCT: 36.5 % (ref 36.0–46.0)
Hemoglobin: 12.3 g/dL (ref 12.0–15.0)
Immature Granulocytes: 1 %
Lymphocytes Relative: 23 %
Lymphs Abs: 2 10*3/uL (ref 0.7–4.0)
MCH: 29.7 pg (ref 26.0–34.0)
MCHC: 33.7 g/dL (ref 30.0–36.0)
MCV: 88.2 fL (ref 80.0–100.0)
Monocytes Absolute: 0.6 10*3/uL (ref 0.1–1.0)
Monocytes Relative: 7 %
Neutro Abs: 5.8 10*3/uL (ref 1.7–7.7)
Neutrophils Relative %: 67 %
Platelets: 250 10*3/uL (ref 150–400)
RBC: 4.14 MIL/uL (ref 3.87–5.11)
RDW: 13.8 % (ref 11.5–15.5)
WBC: 8.5 10*3/uL (ref 4.0–10.5)
nRBC: 0 % (ref 0.0–0.2)

## 2019-08-20 LAB — COMPREHENSIVE METABOLIC PANEL
ALT: 20 U/L (ref 0–44)
AST: 17 U/L (ref 15–41)
Albumin: 4 g/dL (ref 3.5–5.0)
Alkaline Phosphatase: 55 U/L (ref 38–126)
Anion gap: 8 (ref 5–15)
BUN: 9 mg/dL (ref 6–20)
CO2: 26 mmol/L (ref 22–32)
Calcium: 9.3 mg/dL (ref 8.9–10.3)
Chloride: 101 mmol/L (ref 98–111)
Creatinine, Ser: 0.55 mg/dL (ref 0.44–1.00)
GFR calc Af Amer: >60 mL/min (ref >60)
GFR calc non Af Amer: >60 mL/min (ref >60)
Glucose, Bld: 107 mg/dL — ABNORMAL HIGH (ref 70–99)
Potassium: 4 mmol/L (ref 3.5–5.1)
Sodium: 135 mmol/L (ref 135–145)
Total Bilirubin: 0.6 mg/dL (ref 0.3–1.2)
Total Protein: 6.7 g/dL (ref 6.5–8.1)

## 2019-08-20 LAB — URINALYSIS, COMPLETE (UACMP) WITH MICROSCOPIC
Bacteria, UA: NONE SEEN
Bilirubin Urine: NEGATIVE
Glucose, UA: NEGATIVE mg/dL
Hgb urine dipstick: NEGATIVE
Ketones, ur: NEGATIVE mg/dL
Leukocytes,Ua: NEGATIVE
Nitrite: NEGATIVE
Protein, ur: NEGATIVE mg/dL
Specific Gravity, Urine: 1.001 — ABNORMAL LOW (ref 1.005–1.030)
pH: 7 (ref 5.0–8.0)

## 2019-08-20 LAB — LIPASE, BLOOD: Lipase: 40 U/L (ref 11–51)

## 2019-08-20 LAB — TROPONIN I (HIGH SENSITIVITY): Troponin I (High Sensitivity): <2 ng/L (ref <18)

## 2019-08-20 MED ORDER — ONDANSETRON 4 MG PO TBDP
4.0000 mg | ORAL_TABLET | Freq: Once | ORAL | Status: AC
  Administered 2019-08-20: 4 mg via ORAL
  Filled 2019-08-20: qty 1

## 2019-08-20 MED ORDER — FAMOTIDINE 20 MG PO TABS
20.0000 mg | ORAL_TABLET | Freq: Two times a day (BID) | ORAL | 0 refills | Status: DC
Start: 2019-08-20 — End: 2019-09-02

## 2019-08-20 NOTE — ED Notes (Addendum)
EDP at bedside,  pt currently not wearing mask. pt c/o feeling nausea and stomach being swollen. Pt concerned about heavy metals that were found on her toxicology report. Pt c/o being sore in stomach when EDP palpates.  Pt denies any nausea.  Pt states she has stool samples that she has to collect for her PCP.  Pt reports various stools. Pt reports she has been sick for 35 days.  Pt reports always being sick because of her stomach.  Denies any ibuprofen use and any alcohol or drug use.  Pt has hx of scheduled [place] for thyroid.

## 2019-08-20 NOTE — ED Triage Notes (Addendum)
Pt presents to ED for an wellness evaluation. Pt states she just "feels not well" and has nausea and hasn't been able to function normally for the past month. Earlier today she felt like her heart was beating fast and irregularly.  Denies those symptoms currently and reports just nausea. Pt states she has been seen by multiple doctors and has been seen in this ED for the same. Pt states she did a hair analysis toxicology screen through the [employer] and was told she had high levels of barium and strontium and Thallium. Pt just wants to get checked out so she can start to feel better.

## 2019-08-20 NOTE — Assessment & Plan Note (Signed)
Acute, ongoing.  Given abnormalities of stools, will test for C. Diff, ova and parasites, and stool culture.  Patient given instructions on collection and advised to return sample to clinic.  Follow up as scheduled or sooner if acute needs arise.

## 2019-08-20 NOTE — ED Provider Notes (Signed)
[facility] Emergency Department Provider Note  ____________________________________________   First MD Initiated Contact with Patient 08/20/19 0720     (approximate)  I have reviewed the triage vital signs and the nursing notes.  History  Chief Complaint Nausea and Irregular Heart Beat    HPI Isabel Meyers is a 49 y.o. female with PMHx as below who presents to the ER for general evaluation. Patient states she has been "unwell" for the last 35 days. States she hasn't been able to function like she normally does, has low energy levels. She has been experiencing heart pounding sensations on and off over this time period. She has also been having stomach issues - reports nausea and bloating.  Denies any vomiting.  States her stools are constantly changing in terms of caliber, color, odor.  Her PCP recently referred her to GI for this.  She also states that she has had increased foul body odor over this time period. She sent her hair for a toxicology sample and recently got her report that said she had high levels of Barium, Strontium, and Thallium.   Also complains of rapidly changing HR and oxygen levels, has been evaluated in the ED several times for this, referred to cardiology and pulmonology. Per chart review, seen by Dr. Mariah Milling of cardiology, [facility] monitor ordered. Seen by pulmonology, stress test and PFTs ordered.   Recent TSH studies done last month WNL.   Past Medical Hx Past Medical History:  Diagnosis Date  . Anxiety   . Irritable bowel   . Thyroid disease     Problem List Patient Active Problem List   Diagnosis Date Noted  . History of thyroid nodule 07/26/2019  . Shortness of breath 07/16/2019  . Chest tightness 07/13/2019    Past Surgical Hx Past Surgical History:  Procedure Laterality Date  . BREAST SURGERY    . NOSE SURGERY    . UTERINE FIBROID SURGERY      Medications Prior to Admission medications   Not on File     Allergies Selsun blue dry scalp [pyrithione zinc], Levaquin [levofloxacin], Selenium sulfide, and Salicylic acid  Family Hx Family History  Problem Relation Age of Onset  . Clotting disorder Mother   . Atrial fibrillation Father   . Mitral valve prolapse Sister     Social Hx Social History   Tobacco Use  . Smoking status: Never Smoker  . Smokeless tobacco: Never Used  Substance Use Topics  . Alcohol use: Never  . Drug use: Never     Review of Systems  Constitutional: Negative for fever. Negative for chills. + feeling generally unwell Eyes: Negative for visual changes. ENT: Negative for sore throat. Cardiovascular: Negative for chest pain. + irregular heart beat Respiratory: Negative for shortness of breath. Gastrointestinal: + nausea, bloating Genitourinary: Negative for dysuria. Musculoskeletal: Negative for leg swelling. Skin: Negative for rash. Neurological: Negative for headaches.   Physical Exam  Vital Signs: ED Triage Vitals  Enc Vitals Group     BP 08/20/19 0140 126/78     Pulse Rate 08/20/19 0140 (!) 109     Resp 08/20/19 0140 18     Temp 08/20/19 0140 98.1 F (36.7 C)     Temp Source 08/20/19 0140 Oral     SpO2 08/20/19 0140 100 %     Weight 08/20/19 0152 140 lb (63.5 kg)     Height 08/20/19 0152 5\' 4"  (1.626 m)     Head Circumference --  Peak Flow --      Pain Score 08/20/19 0152 0     Pain Loc --      Pain Edu? --      Excl. in [place]? --     Constitutional: Alert and oriented. Well appearing. NAD. Seems fairly fixated on her heavy metal screening.  Head: Normocephalic. Atraumatic. Eyes: Conjunctivae clear. Sclera anicteric. Pupils equal and symmetric. Nose: No masses or lesions. No congestion or rhinorrhea. Mouth/Throat: Wearing mask.  Neck: No stridor. Trachea midline.  Cardiovascular: Normal rate, regular rhythm. Extremities well perfused. Respiratory: Normal respiratory effort.  Lungs CTAB. Gastrointestinal: Soft. Non-distended.  Non-tender. BS present. Genitourinary: Deferred. Musculoskeletal: No lower extremity edema. No deformities. Neurologic:  Normal speech and language. No gross focal or lateralizing neurologic deficits are appreciated.  Skin: Skin is warm, dry and intact. No rash noted. Psychiatric: Slightly odd affect. Very talkative.   EKG  Personally reviewed and interpreted by myself.   Date: 08/20/19 Time: 0209 Rate: 92 Rhythm: sinus Axis: normal Intervals: WNL No evidence of prolonged QTC, Brugada, or Wolff-Parkinson-White No STEMI    Radiology  N/A   Procedures  Procedure(s) performed (including critical care):  Procedures   Initial Impression / Assessment and Plan / MDM / ED Course  49 y.o. female who presents to the ED for general medication evaluation, as above. Concerned about feeling unwell x 35 days, with complaints of heart pounding/changing HR as well as nausea/bloating.  Patient is concerned symptoms are related to heavy metal poisoning.  Ddx: electrolyte abnormality, arrhythmia, gastritis, anemia, heavy metal abnormalities, psychiatric.  Less likely thyroid etiology, on chart review patient had a normal TSH study 07/13/2019.  Will plan for labs, EKG, nausea medication  Work-up essentially unremarkable.  No electrolyte derangements.  No anemia.  Normal lipase.  Negative troponin.  EKG as above, no evidence of arrhythmia or acute ischemia.  Given ODT Zofran for symptom control.  Patient already has referral for GI in place.  Offered Rx for famotidine, patient is somewhat hesitant because she does not like to take frequent medications and "does not want to affect the absorption of her stomach".  Provided an Rx and advised she discuss with her PCP.  Patient agreeable with plan.  Advised outpatient follow-up and given return precautions.     _______________________________   As part of my medical decision making I have reviewed available labs, radiology tests, reviewed old  records/performed chart review.   Final Clinical Impression(s) / ED Diagnosis  Final diagnoses:  Evaluation by medical service required       Note:  This document was prepared using Dragon voice recognition software and may include unintentional dictation errors.   Lilia Pro., MD 08/20/19 [phone]

## 2019-08-20 NOTE — Telephone Encounter (Signed)
Pt has a CPST and PFT scheduled in [place] for 08/26/2019 with a COVID test on 08/21/2019 in [place]. Pt had her own toxicology work up and she states "that she has so much toxins in her body that it is a wonder if she is alive".  Pt states that she has done research on the COVID test and this test always makes her very sick and has researched what is in the COVID test contains. Pt wanted to know if there is another type of COVID test that she could take other than the swab?  She stated that she has already had 3 swab COVID tests and she feels like this test is part of the reason why she doesn't feel well.  Please advise.  You may reach patient on either [phone] or [phone]. Rhonda J Cobb

## 2019-08-20 NOTE — Discharge Instructions (Addendum)
Thank you for letting us take care of you in the emergency department today.   We have written you a prescription for famotidine, an acid protecting medication for your stomach.  You can discuss this medication with your doctor, if you feel comfortable starting it please take as directed.  Please follow up with: Your primary care doctor to review your ER visit and follow up on your symptoms.  Please also follow-up with them regarding your heavy metal testing.   Please return to the ER for any new or worsening symptoms.

## 2019-08-20 NOTE — ED Notes (Signed)
There is a blue and red stop for extra blood sent down

## 2019-08-20 NOTE — Telephone Encounter (Signed)
I spoke with Dr. Jayme Cloud and she states that there's not any other type of Covid test. I advised her that they do not go up in the nose as far as they used to. She states that she will go ahead and take the test. Nothing further is needed.

## 2019-08-21 ENCOUNTER — Telehealth: Payer: Self-pay | Admitting: Nurse Practitioner

## 2019-08-21 ENCOUNTER — Other Ambulatory Visit: Admission: RE | Admit: 2019-08-21 | Payer: MEDICAID | Source: Ambulatory Visit

## 2019-08-21 NOTE — Telephone Encounter (Signed)
Copied from CRM [id number]. Topic: General - Other >> Aug 21, 2019 11:37 AM Marylen Ponto wrote: Reason for CRM: Pt stated she needs to speak with [name] regarding some lab results that she has. Pt also stated she needs another stool collection bag. Pt requests call back. Cb# [phone]   Called pt to let her know that she can come and get another collection bag, no answer, left vm

## 2019-08-22 NOTE — Telephone Encounter (Signed)
Noted, none of these issues seem to be pulmonary related.

## 2019-08-22 NOTE — Telephone Encounter (Signed)
I called and spoke with the patient and she states that she did not go have the covid test because she had to go to the ER due to her having stomach pain. She states that she didn't want any further toxins in her body since she has been sick. She would like to give herself 2 weeks before getting the covid test, so she would like her procedures rescheduled for this time frame. She states that she will go to her PCP's office and have them to scan the toxicology report into her chart.   Bjorn Loser can you please look into this.

## 2019-08-22 NOTE — Telephone Encounter (Signed)
Patient did not get Covid 19 test. Patient scheduled for CPST and PFT on 08/26/2019. Patient would like to reschedule both procedures on same day. Patient phone number is [phone] c or [phone] c.

## 2019-08-23 NOTE — Telephone Encounter (Signed)
PFT and CPX canceled due to patient being unable to take the COVID Test. Isabel Meyers

## 2019-08-26 ENCOUNTER — Encounter (HOSPITAL_COMMUNITY): Payer: Self-pay

## 2019-08-28 NOTE — Telephone Encounter (Signed)
Pt returned call. CPX has been scheduled for 09/24/2019 at 1:00 pm at [facility]. LMOVM for Katie with PFT dept with [facility] to schedule PFT on same day.  Pt only wants to do COVID test once. Waiting on Katie to return call. Rhonda J Cobb

## 2019-08-29 ENCOUNTER — Other Ambulatory Visit: Payer: Self-pay

## 2019-08-29 DIAGNOSIS — R0602 Shortness of breath: Secondary | ICD-10-CM

## 2019-08-29 NOTE — Telephone Encounter (Signed)
PFT has been scheduled at [facility] for 09/24/2019 at 5:15. Pt to arrive at 5:00 at the [facility].  Message sent to Teena Dunk to order COVID Test for 09/20/2019 at [facility] between 8:00 am to 1:00 pm.   Pt has been contacted and is aware of appointments, date and times.  Pt advised that she will need to R/S her OV with Dr. Jayme Cloud to after the above tests.  Appointments mailed to patient along with instructions for each test. Nothing else needed at this time. Rhonda J Cobb

## 2019-09-02 ENCOUNTER — Other Ambulatory Visit: Payer: Self-pay | Admitting: Nurse Practitioner

## 2019-09-02 ENCOUNTER — Encounter: Payer: Self-pay | Admitting: Nurse Practitioner

## 2019-09-02 ENCOUNTER — Ambulatory Visit (INDEPENDENT_AMBULATORY_CARE_PROVIDER_SITE_OTHER): Payer: Self-pay | Admitting: Nurse Practitioner

## 2019-09-02 ENCOUNTER — Other Ambulatory Visit: Payer: Self-pay

## 2019-09-02 VITALS — BP 99/68 | HR 99 | Temp 98.1°F | Ht 63.0 in | Wt 142.0 lb

## 2019-09-02 DIAGNOSIS — Z1211 Encounter for screening for malignant neoplasm of colon: Secondary | ICD-10-CM

## 2019-09-02 DIAGNOSIS — Z1212 Encounter for screening for malignant neoplasm of rectum: Secondary | ICD-10-CM

## 2019-09-02 DIAGNOSIS — R1084 Generalized abdominal pain: Secondary | ICD-10-CM | POA: Insufficient documentation

## 2019-09-02 NOTE — Patient Instructions (Addendum)
Check out: alpha gal allergy.

## 2019-09-02 NOTE — Assessment & Plan Note (Signed)
Chronic, ongoing.  Will recheck peripheral smear today.  Will also recheck CMP, CBC, iron levels, and thallium and alpha gal IgE per patient request.  Stool tests pending.

## 2019-09-02 NOTE — Progress Notes (Signed)
BP 99/68 (BP Location: Left Arm, Patient Position: Sitting, Cuff Size: Normal)   Pulse 99   Temp 98.1 F (36.7 C) (Oral)   Ht 5\' 3"  (1.6 m)   Wt 142 lb (64.4 kg)   SpO2 99%   BMI 25.15 kg/m    Subjective:    Patient ID: Isabel Meyers, female    DOB: 07/10/1970, 49 y.o.   MRN: [id number]  HPI: Isabel Meyers is a 49 y.o. female presenting for follow up.  Chief Complaint  Patient presents with  . Abnormal Labs    Patient got labs sent to [facility] and had Barium and other toxins in her system. Patient sent a hair sample.   Patient presents for follow up.  Patient reports she had her hair tested and states that her barium, strontium, thallium, and zinc levels are extremely elevated.  Has been in touch with an Chief Financial Officer who has told her that the amounts on her report are much higher than people who are routinely exposed to these chemicals and who work at [employer].   She has been doing the [facility] metal detox program and has been feeling mostly better.  She thinks all of her symptoms can be explained by the toxic levels of chemicals that she tested for.  She reports not having any more of the large episodes where she feels short of breath and her heart rate increases.  She is having some intermittent dizziness but it is not severe.  She has further workup planned for June with her pulmonologist.  She is also having her thyroid ultrasound later this week.  Of note, she also recently found out that her car also had gas leaks, was leaking carbon monoxide, and the anitfreeze resoivere was dripping.  Since finding out this information, she has sold her car and has a new one.  Patient desires blood testing today for blood counts, repeat smear, iron counts, thallium level, and alpha-gal level.      Allergies  Allergen Reactions  . Selsun Blue Dry Scalp [Pyrithione Zinc] Shortness Of Breath  . Levaquin [Levofloxacin] Other (See Comments)    "I almost died"  . Selenium  Sulfide   . Salicylic Acid Rash   Outpatient Encounter Medications as of 09/02/2019  Medication Sig  . [DISCONTINUED] famotidine (PEPCID) 20 MG tablet Take 1 tablet (20 mg total) by mouth 2 (two) times daily. (Patient not taking: Reported on 09/02/2019)   No facility-administered encounter medications on file as of 09/02/2019.   Patient Active Problem List   Diagnosis Date Noted  . Generalized abdominal pain 09/02/2019  . Abnormal stool caliber 08/20/2019  . History of thyroid nodule 07/26/2019  . Shortness of breath 07/16/2019  . Chest tightness 07/13/2019   Past Medical History:  Diagnosis Date  . Anxiety   . Irritable bowel   . Thyroid disease    Relevant past medical, surgical, family and social history reviewed and updated as indicated. Interim medical history since our last visit reviewed. Allergies and medications reviewed and updated.  Review of Systems  Constitutional: Positive for activity change. Negative for appetite change, fatigue and fever.  HENT: Negative.   Eyes: Negative.  Negative for visual disturbance.  Respiratory: Negative.  Negative for cough, shortness of breath and wheezing.   Cardiovascular: Negative.  Negative for palpitations and leg swelling.  Gastrointestinal: Positive for abdominal distention, abdominal pain and diarrhea. Negative for blood in stool, constipation, nausea and vomiting.  Musculoskeletal: Negative.   Skin: Negative.   Neurological:  Positive for dizziness and light-headedness. Negative for tremors, facial asymmetry and weakness.  Psychiatric/Behavioral: Negative.     Per HPI unless specifically indicated above     Objective:    BP 99/68 (BP Location: Left Arm, Patient Position: Sitting, Cuff Size: Normal)   Pulse 99   Temp 98.1 F (36.7 C) (Oral)   Ht 5\' 3"  (1.6 m)   Wt 142 lb (64.4 kg)   SpO2 99%   BMI 25.15 kg/m   Wt Readings from Last 3 Encounters:  09/02/19 142 lb (64.4 kg)  08/20/19 140 lb (63.5 kg)  08/02/19 140  lb 9.6 oz (63.8 kg)    Physical Exam Vitals and nursing note reviewed.  Constitutional:      General: She is not in acute distress.    Appearance: Normal appearance. She is not toxic-appearing.  HENT:     Nose: Nose normal. No congestion.  Eyes:     General: No scleral icterus.    Extraocular Movements: Extraocular movements intact.  Cardiovascular:     Rate and Rhythm: Normal rate and regular rhythm.     Heart sounds: Normal heart sounds. No murmur.  Pulmonary:     Effort: Pulmonary effort is normal.     Breath sounds: Normal breath sounds. No wheezing.  Abdominal:     General: Abdomen is flat. Bowel sounds are normal. There is no distension.     Palpations: There is no mass.  Musculoskeletal:     Cervical back: Normal range of motion. No rigidity.  Skin:    General: Skin is warm and dry.     Coloration: Skin is not jaundiced or pale.  Neurological:     General: No focal deficit present.     Mental Status: She is alert.     Motor: No weakness.     Gait: Gait normal.  Psychiatric:        Mood and Affect: Mood normal.        Behavior: Behavior normal.        Thought Content: Thought content normal.        Judgment: Judgment normal.        Assessment & Plan:   Problem List Items Addressed This Visit      Other   Generalized abdominal pain - Primary    Chronic, ongoing.  Will recheck peripheral smear today.  Will also recheck CMP, CBC, iron levels, and thallium and alpha gal IgE per patient request.  Stool tests pending.       Relevant Orders   Pathologist smear review   Comprehensive metabolic panel   CBC with Differential/Platelet   Iron, TIBC and Ferritin Panel   Thallium   Alpha Gal IgE    Other Visit Diagnoses    Encounter for colorectal cancer screening       Relevant Orders   Ambulatory referral to Gastroenterology       Follow up plan: Return if symptoms worsen or fail to improve.

## 2019-09-03 ENCOUNTER — Other Ambulatory Visit: Payer: Self-pay

## 2019-09-03 DIAGNOSIS — R195 Other fecal abnormalities: Secondary | ICD-10-CM

## 2019-09-03 DIAGNOSIS — R1084 Generalized abdominal pain: Secondary | ICD-10-CM

## 2019-09-03 LAB — PATHOLOGIST SMEAR REVIEW: Immature Grans (Abs): 0 10*3/uL (ref 0.0–0.1)

## 2019-09-04 ENCOUNTER — Other Ambulatory Visit: Payer: Self-pay

## 2019-09-05 ENCOUNTER — Telehealth: Payer: Self-pay

## 2019-09-05 ENCOUNTER — Ambulatory Visit: Payer: Self-pay | Admitting: Pulmonary Disease

## 2019-09-05 ENCOUNTER — Encounter: Payer: Self-pay | Admitting: *Deleted

## 2019-09-05 LAB — COMPREHENSIVE METABOLIC PANEL
ALT: 17 IU/L (ref 0–32)
AST: 17 IU/L (ref 0–40)
Albumin/Globulin Ratio: 1.9 (ref 1.2–2.2)
Albumin: 4.3 g/dL (ref 3.8–4.8)
Alkaline Phosphatase: 63 IU/L (ref 39–117)
BUN/Creatinine Ratio: 11 (ref 9–23)
BUN: 8 mg/dL (ref 6–24)
Bilirubin Total: 0.3 mg/dL (ref 0.0–1.2)
CO2: 24 mmol/L (ref 20–29)
Calcium: 9.5 mg/dL (ref 8.7–10.2)
Chloride: 102 mmol/L (ref 96–106)
Creatinine, Ser: 0.71 mg/dL (ref 0.57–1.00)
GFR calc Af Amer: 115 mL/min/{1.73_m2} (ref >59)
GFR calc non Af Amer: 100 mL/min/{1.73_m2} (ref >59)
Globulin, Total: 2.3 g/dL (ref 1.5–4.5)
Glucose: 74 mg/dL (ref 65–99)
Potassium: 4.2 mmol/L (ref 3.5–5.2)
Sodium: 141 mmol/L (ref 134–144)
Total Protein: 6.6 g/dL (ref 6.0–8.5)

## 2019-09-05 LAB — PATHOLOGIST SMEAR REVIEW
Basophils Absolute: 0 10*3/uL (ref 0.0–0.2)
Basos: 1 %
EOS (ABSOLUTE): 0.1 10*3/uL (ref 0.0–0.4)
Eos: 1 %
Hematocrit: 39 % (ref 34.0–46.6)
Hemoglobin: 13 g/dL (ref 11.1–15.9)
Immature Grans (Abs): 0 10*3/uL (ref 0.0–0.1)
Immature Granulocytes: 0 %
Lymphocytes Absolute: 1.5 10*3/uL (ref 0.7–3.1)
Lymphs: 17 %
MCH: 30 pg (ref 26.6–33.0)
MCHC: 33.3 g/dL (ref 31.5–35.7)
MCV: 90 fL (ref 79–97)
Monocytes Absolute: 0.5 10*3/uL (ref 0.1–0.9)
Monocytes: 6 %
Neutrophils Absolute: 6.4 10*3/uL (ref 1.4–7.0)
Neutrophils: 75 %
Platelets: 212 10*3/uL (ref 150–450)
RBC: 4.34 x10E6/uL (ref 3.77–5.28)
RDW: 12.7 % (ref 11.7–15.4)
WBC: 8.6 10*3/uL (ref 3.4–10.8)

## 2019-09-05 LAB — CLOSTRIDIUM DIFFICILE EIA: C difficile Toxins A+B, EIA: NEGATIVE

## 2019-09-05 LAB — IRON,TIBC AND FERRITIN PANEL
Ferritin: 17 ng/mL (ref 15–150)
Iron Saturation: 45 % (ref 15–55)
Iron: 172 ug/dL — ABNORMAL HIGH (ref 27–159)
Total Iron Binding Capacity: 384 ug/dL (ref 250–450)
UIBC: 212 ug/dL (ref 131–425)

## 2019-09-05 LAB — ALPHA GAL IGE: Alpha Gal IgE*: <0.1 kU/L (ref <0.10)

## 2019-09-05 LAB — THALLIUM: Thallium: <1 ng/ml (ref <5.0)

## 2019-09-09 LAB — STOOL CULTURE: E coli, Shiga toxin Assay: NEGATIVE

## 2019-09-09 LAB — FECAL LEUKOCYTES

## 2019-09-09 LAB — SPECIMEN STATUS REPORT

## 2019-09-10 LAB — O+P EXAM, FORMALIN ONLY

## 2019-09-10 LAB — SPECIMEN STATUS REPORT

## 2019-09-11 ENCOUNTER — Telehealth: Payer: Self-pay

## 2019-09-11 NOTE — Telephone Encounter (Signed)
-----   Message from Antonieta Iba, MD sent at 09/11/2019  2:32 PM EDT ----- Event monitor No significant arrhythmia noted Triggered events not associated with significant arrhythmia

## 2019-09-11 NOTE — Telephone Encounter (Signed)
Attempted to call patient. LMTCB 09/11/2019   

## 2019-09-12 LAB — OVA AND PARASITE EXAMINATION

## 2019-09-13 ENCOUNTER — Telehealth: Payer: Self-pay

## 2019-09-20 ENCOUNTER — Other Ambulatory Visit
Admission: RE | Admit: 2019-09-20 | Discharge: 2019-09-20 | Disposition: A | Discharge status: Home / Self Care | Payer: HRSA Program | Source: Ambulatory Visit | Attending: Pulmonary Disease | Admitting: Pulmonary Disease

## 2019-09-20 ENCOUNTER — Other Ambulatory Visit: Payer: Self-pay

## 2019-09-20 DIAGNOSIS — Z01812 Encounter for preprocedural laboratory examination: Secondary | ICD-10-CM | POA: Diagnosis present

## 2019-09-20 DIAGNOSIS — Z20822 Contact with and (suspected) exposure to covid-19: Secondary | ICD-10-CM | POA: Diagnosis not present

## 2019-09-21 LAB — SARS CORONAVIRUS 2 (TAT 6-24 HRS): SARS Coronavirus 2: NEGATIVE

## 2019-09-24 ENCOUNTER — Ambulatory Visit (HOSPITAL_COMMUNITY): Payer: Self-pay | Attending: Pulmonary Disease

## 2019-09-24 ENCOUNTER — Other Ambulatory Visit: Payer: Self-pay

## 2019-09-24 ENCOUNTER — Ambulatory Visit: Payer: Self-pay | Attending: Pulmonary Disease

## 2019-09-24 DIAGNOSIS — R0602 Shortness of breath: Secondary | ICD-10-CM

## 2019-09-24 MED ORDER — ALBUTEROL SULFATE (2.5 MG/3ML) 0.083% IN NEBU
2.5000 mg | INHALATION_SOLUTION | Freq: Once | RESPIRATORY_TRACT | Status: AC
  Administered 2019-09-24: 2.5 mg via RESPIRATORY_TRACT
  Filled 2019-09-24: qty 3

## 2019-09-24 NOTE — Telephone Encounter (Signed)
Attempted to call patient. LMTCB 09/24/2019   

## 2019-09-25 ENCOUNTER — Ambulatory Visit: Payer: Self-pay

## 2019-09-25 NOTE — Chronic Care Management (AMB) (Signed)
  Care Management   Follow Up Note   09/25/2019 Name: [name] MRN: [id number] DOB: 1970/10/08  Referred by: Wells Guiles, NP Reason for referral : No chief complaint on file.   Isabel Meyers is a 49 y.o. year old female who is a primary care patient of [name] I, NP. The care management team was consulted for assistance with care management and care coordination needs.    Review of patient status, including review of consultants reports, relevant laboratory and other test results, and collaboration with appropriate care team members and the patient's provider was performed as part of comprehensive patient evaluation and provision of chronic care management services.    LCSW completed CCM outreach attempt today but was unable to reach patient successfully. A HIPPA compliant voice message was left encouraging patient to return call once available.  The care management team is available to follow up with the patient after provider conversation with the patient regarding recommendation for care management engagement and subsequent re-referral to the care management team.   Dickie La, BSW, MSW, LCSW [facility] Care Management [facility]  [facility] [email or web address] Phone: [phone]

## 2019-09-26 NOTE — Telephone Encounter (Signed)
Late entry charting:  Call to patient to give normal heart monitor results.   Pt insist that results are incorrect. She reported she has a stress test yesterday in [place] and tech told her heart rate was high throughout.   Pt reports she tracks HR on app on her phone and its "always in the 130s". After chart review, resting HR at last OV and ED visits have been consistent with zio monitor results.   Pt reports frustration that zio monitor cannot be correct.   I advised her to obtain kardis heart monitor system as they are recommended by our cardiologist and send reading in aprx 1 week.   I told pt I will make Dr. Mariah Milling aware of case.

## 2019-09-27 NOTE — Telephone Encounter (Signed)
No further cardiac workup needed, monitor with no arrhythmia Scheduled to see pulmonary in f/u. Her cardiopulmonary test was also normal. Elevated rate with exertion suggesting need for regular exercise program.  Previous reports by the patient of hypoxia, bradycardia have not been confirmed by monitors or by numerous ER visits Now she reports tachycardia, most likely secondary to activity and possible underlying anxiety disorder  Two recent visits to the ER for various issues, general malaise, nausea,  elevated heavy metal, among other concerns I think we need to consider evaluation of underlying anxiety d/o.I don't know her well enough to exclude other psych dx

## 2019-09-27 NOTE — Telephone Encounter (Signed)
Thank you for the update and for working this up for [place].  I appreciate your time and plan on following up with her accordingly.

## 2019-10-23 ENCOUNTER — Ambulatory Visit: Payer: Self-pay | Admitting: Pulmonary Disease

## 2019-10-23 ENCOUNTER — Encounter: Payer: Self-pay | Admitting: Pulmonary Disease

## 2020-07-08 ENCOUNTER — Telehealth: Payer: Self-pay | Admitting: *Deleted

## 2020-07-08 NOTE — Telephone Encounter (Signed)
Tried to call pt. For a physical phone # no good

## 2021-03-11 NOTE — Progress Notes (Signed)
Acute Office Visit  Subjective:    Patient ID: Isabel Meyers, female    DOB: [birth year], 50 y.o.   MRN: [id number]  Chief Complaint  Patient presents with   Follow-up    Pt states she has not been feeling well, states she has been feeling bad for the last 34 days     HPI Patient is in today for follow up after ongoing not feeling well after being diagnosed with the flu on 02/26/21. She was given augmentin, but after taking 1 dose, it had hurt her stomach and given her diarrhea and she didn't take any other doses. She still endorses nasal congestion that is a yellowish-green color, shortness of breath, oxygen levels 92-96% at home, and an elevated heart rate when she is moving around. She states her eyes are itching and crusty in the mornings. She has been doing sinus cleanses, and drinking water with lemon, garlic and ginger. She states she is really sensitive to medicines. She denies fevers, sore throat, and chest pain. She feels like her immune system is down because she is not able to fight off this infection.   She is concerned that her creatinine and BUN were slightly low at [facility] and her platelets fluctuated from 177 to 280s. She would like these labs re-checked today.    Past Medical History:  Diagnosis Date   Anxiety    Irritable bowel    Thyroid disease     Past Surgical History:  Procedure Laterality Date   BREAST SURGERY     NOSE SURGERY     UTERINE FIBROID SURGERY      Family History  Problem Relation Age of Onset   Clotting disorder Mother    Atrial fibrillation Father    Mitral valve prolapse Sister     Social History   Socioeconomic History   Marital status: Divorced    Spouse name: Not on file   Number of children: Not on file   Years of education: Not on file   Highest education level: Not on file  Occupational History   Not on file  Tobacco Use   Smoking status: Never   Smokeless tobacco: Never  Vaping Use   Vaping Use: Never used  Substance  and Sexual Activity   Alcohol use: Never   Drug use: Never   Sexual activity: Not on file  Other Topics Concern   Not on file  Social History Narrative   Not on file   Social Determinants of Health   Financial Resource Strain: Not on file  Food Insecurity: Not on file  Transportation Needs: Not on file  Physical Activity: Not on file  Stress: Not on file  Social Connections: Not on file  Intimate Partner Violence: Not on file    No outpatient medications prior to visit.   No facility-administered medications prior to visit.    Allergies  Allergen Reactions   Selsun Blue Dry Scalp [Pyrithione Zinc] Shortness Of Breath   Levaquin [Levofloxacin] Other (See Comments)    "I almost died"   Selenium Sulfide    Salicylic Acid Rash    Review of Systems  Constitutional:  Negative for fever (resolved).  HENT:  Positive for congestion, ear pain (right ear), sinus pressure and sinus pain. Negative for sore throat.   Eyes:  Positive for discharge (crusty) and itching.  Respiratory:  Positive for cough (improving) and shortness of breath.   Cardiovascular: Negative.   Gastrointestinal:  Positive for abdominal pain (fullness) and diarrhea.  Genitourinary: Negative.   Skin: Negative.   Neurological: Negative.       Objective:    Physical Exam Vitals and nursing note reviewed.  Constitutional:      General: She is not in acute distress.    Appearance: Normal appearance.  HENT:     Head: Normocephalic.     Right Ear: Ear canal and external ear normal. Tympanic membrane is erythematous.     Left Ear: Tympanic membrane, ear canal and external ear normal.  Eyes:     Conjunctiva/sclera: Conjunctivae normal.  Cardiovascular:     Rate and Rhythm: Normal rate and regular rhythm.     Pulses: Normal pulses.     Heart sounds: Normal heart sounds.  Pulmonary:     Effort: Pulmonary effort is normal.     Breath sounds: Normal breath sounds.  Musculoskeletal:     Cervical back:  Normal range of motion and neck supple. No tenderness.  Lymphadenopathy:     Cervical: No cervical adenopathy.  Skin:    General: Skin is warm.  Neurological:     General: No focal deficit present.     Mental Status: She is alert and oriented to person, place, and time.  Psychiatric:        Mood and Affect: Mood normal.        Behavior: Behavior normal.        Thought Content: Thought content normal.        Judgment: Judgment normal.    BP 108/71   Pulse 93   Temp 98.1 F (36.7 C) (Oral)   Ht 5' 4.2" (1.631 m)   Wt 146 lb 12.8 oz (66.6 kg)   LMP 03/11/2021 (Exact Date)   SpO2 96%   BMI 25.04 kg/m  Wt Readings from Last 3 Encounters:  03/12/21 146 lb 12.8 oz (66.6 kg)  09/02/19 142 lb (64.4 kg)  08/20/19 140 lb (63.5 kg)    Health Maintenance Due  Topic Date Due   Hepatitis C Screening  Never done   PAP SMEAR-Modifier  Never done   COLONOSCOPY (Pts 45-58yrs Insurance coverage will need to be confirmed)  Never done   MAMMOGRAM  Never done    There are no preventive care reminders to display for this patient.   Lab Results  Component Value Date   TSH 0.674 07/13/2019   Lab Results  Component Value Date   WBC 8.6 09/02/2019   HGB 13.0 09/02/2019   HCT 39.0 09/02/2019   MCV 90 09/02/2019   PLT 212 09/02/2019   Lab Results  Component Value Date   NA 141 09/02/2019   K 4.2 09/02/2019   CO2 24 09/02/2019   GLUCOSE 74 09/02/2019   BUN 8 09/02/2019   CREATININE 0.71 09/02/2019   BILITOT 0.3 09/02/2019   ALKPHOS 63 09/02/2019   AST 17 09/02/2019   ALT 17 09/02/2019   PROT 6.6 09/02/2019   ALBUMIN 4.3 09/02/2019   CALCIUM 9.5 09/02/2019   ANIONGAP 8 08/20/2019   Lab Results  Component Value Date   CHOL 159 07/14/2019   Lab Results  Component Value Date   HDL 69 07/14/2019   Lab Results  Component Value Date   LDLCALC 83 07/14/2019   Lab Results  Component Value Date   TRIG 35 07/14/2019   Lab Results  Component Value Date   CHOLHDL 2.3  07/14/2019   No results found for: HGBA1C     Assessment & Plan:   Problem List Items Addressed This  Visit       Other   History of thyroid nodule   Relevant Orders   Thyroid Panel With TSH   Other Visit Diagnoses     Acute non-recurrent sinusitis, unspecified location    -  Primary   Will treat ongoing symptoms with doxycyline. Albuterol inhaler sample given prn shortness of breath. Will recheck CMP, CBC per request. F/U if not improving   Relevant Medications   doxycycline (VIBRA-TABS) 100 MG tablet   Other Relevant Orders   Comprehensive metabolic panel   CBC with Differential/Platelet        Meds ordered this encounter  Medications   doxycycline (VIBRA-TABS) 100 MG tablet    Sig: Take 1 tablet (100 mg total) by mouth 2 (two) times daily.    Dispense:  20 tablet    Refill:  0   Albuterol Sulfate, sensor, (PROAIR DIGIHALER) 108 (90 Base) MCG/ACT AEPB    Sig: Inhale 2 puffs into the lungs 4 (four) times daily as needed (shortness of breath).    Dispense:  1 each    Refill:  0     Gerre Scull, NP

## 2021-03-12 ENCOUNTER — Ambulatory Visit (INDEPENDENT_AMBULATORY_CARE_PROVIDER_SITE_OTHER): Payer: Self-pay | Admitting: Nurse Practitioner

## 2021-03-12 ENCOUNTER — Encounter: Payer: Self-pay | Admitting: Nurse Practitioner

## 2021-03-12 ENCOUNTER — Other Ambulatory Visit: Payer: Self-pay

## 2021-03-12 VITALS — BP 108/71 | HR 93 | Temp 98.1°F | Ht 64.2 in | Wt 146.8 lb

## 2021-03-12 DIAGNOSIS — Z8639 Personal history of other endocrine, nutritional and metabolic disease: Secondary | ICD-10-CM

## 2021-03-12 DIAGNOSIS — J019 Acute sinusitis, unspecified: Secondary | ICD-10-CM

## 2021-03-12 MED ORDER — DOXYCYCLINE HYCLATE 100 MG PO TABS: 100.0000 mg | ORAL_TABLET | Freq: Two times a day (BID) | ORAL | 0 refills | Status: DC

## 2021-03-12 MED ORDER — PROAIR DIGIHALER 108 (90 BASE) MCG/ACT IN AEPB: 2.0000 | INHALATION_SPRAY | Freq: Four times a day (QID) | RESPIRATORY_TRACT | 0 refills | Status: DC | PRN

## 2021-03-12 NOTE — Patient Instructions (Addendum)
Erlene Quan, MD   [address]   [place], [place] [address]   [phone]    [facility]

## 2021-03-13 LAB — COMPREHENSIVE METABOLIC PANEL
ALT: 9 IU/L (ref 0–32)
AST: 13 IU/L (ref 0–40)
Albumin/Globulin Ratio: 1.9 (ref 1.2–2.2)
Albumin: 4.2 g/dL (ref 3.8–4.9)
Alkaline Phosphatase: 64 IU/L (ref 44–121)
BUN/Creatinine Ratio: 10 (ref 9–23)
BUN: 6 mg/dL (ref 6–24)
Bilirubin Total: 0.5 mg/dL (ref 0.0–1.2)
CO2: 24 mmol/L (ref 20–29)
Calcium: 9.2 mg/dL (ref 8.7–10.2)
Chloride: 99 mmol/L (ref 96–106)
Creatinine, Ser: 0.61 mg/dL (ref 0.57–1.00)
Globulin, Total: 2.2 g/dL (ref 1.5–4.5)
Glucose: 80 mg/dL (ref 70–99)
Potassium: 4.3 mmol/L (ref 3.5–5.2)
Sodium: 137 mmol/L (ref 134–144)
Total Protein: 6.4 g/dL (ref 6.0–8.5)
eGFR: 108 mL/min/{1.73_m2} (ref >59)

## 2021-03-13 LAB — CBC WITH DIFFERENTIAL/PLATELET
Basophils Absolute: 0.1 10*3/uL (ref 0.0–0.2)
Basos: 1 %
EOS (ABSOLUTE): 0.1 10*3/uL (ref 0.0–0.4)
Eos: 1 %
Hematocrit: 39.5 % (ref 34.0–46.6)
Hemoglobin: 13.2 g/dL (ref 11.1–15.9)
Immature Grans (Abs): 0 10*3/uL (ref 0.0–0.1)
Immature Granulocytes: 0 %
Lymphocytes Absolute: 1.6 10*3/uL (ref 0.7–3.1)
Lymphs: 17 %
MCH: 28.3 pg (ref 26.6–33.0)
MCHC: 33.4 g/dL (ref 31.5–35.7)
MCV: 85 fL (ref 79–97)
Monocytes Absolute: 0.8 10*3/uL (ref 0.1–0.9)
Monocytes: 8 %
Neutrophils Absolute: 7 10*3/uL (ref 1.4–7.0)
Neutrophils: 73 %
Platelets: 299 10*3/uL (ref 150–450)
RBC: 4.67 x10E6/uL (ref 3.77–5.28)
RDW: 14.1 % (ref 11.7–15.4)
WBC: 9.5 10*3/uL (ref 3.4–10.8)

## 2021-03-13 LAB — THYROID PANEL WITH TSH
Free Thyroxine Index: 2.4 (ref 1.2–4.9)
T3 Uptake Ratio: 28 % (ref 24–39)
T4, Total: 8.5 ug/dL (ref 4.5–12.0)
TSH: 0.865 u[IU]/mL (ref 0.450–4.500)

## 2021-03-17 ENCOUNTER — Telehealth: Payer: Self-pay | Admitting: Nurse Practitioner

## 2021-03-17 NOTE — Telephone Encounter (Signed)
Would need to be seen, and unfortunately we are not open the rest of the week. Will need to go to [facility]

## 2021-03-17 NOTE — Telephone Encounter (Signed)
Copied from CRM [id number]. Topic: General - Other >> Mar 16, 2021 10:25 AM Traci Sermon wrote: Reason for CRM: Pt called in stating she has pink eye and wanted to see about getting some eye drops sent over to pharmacy, please advise.

## 2021-03-17 NOTE — Telephone Encounter (Signed)
Attempted to call patient twice, no answer. Unable to leave voicemail.  

## 2021-03-22 ENCOUNTER — Ambulatory Visit (INDEPENDENT_AMBULATORY_CARE_PROVIDER_SITE_OTHER): Payer: Self-pay | Admitting: Nurse Practitioner

## 2021-03-22 ENCOUNTER — Other Ambulatory Visit: Payer: Self-pay

## 2021-03-22 ENCOUNTER — Encounter: Payer: Self-pay | Admitting: Nurse Practitioner

## 2021-03-22 ENCOUNTER — Telehealth: Payer: Self-pay | Admitting: Nurse Practitioner

## 2021-03-22 VITALS — BP 102/70 | HR 105 | Temp 98.8°F | Wt 147.8 lb

## 2021-03-22 DIAGNOSIS — J019 Acute sinusitis, unspecified: Secondary | ICD-10-CM

## 2021-03-22 DIAGNOSIS — J029 Acute pharyngitis, unspecified: Secondary | ICD-10-CM

## 2021-03-22 DIAGNOSIS — Z862 Personal history of diseases of the blood and blood-forming organs and certain disorders involving the immune mechanism: Secondary | ICD-10-CM

## 2021-03-22 MED ORDER — AMOXICILLIN 875 MG PO TABS: 875.0000 mg | ORAL_TABLET | Freq: Two times a day (BID) | ORAL | 0 refills | Status: AC

## 2021-03-22 NOTE — Telephone Encounter (Signed)
Copied from CRM [id number]. Topic: General - Other >> Mar 19, 2021 11:49 AM Gaetana Michaelis A wrote: Reason for CRM: The patient would like to be come to the practice on 03/22/21 at 11:30 to pick up copies of lab results from labwork done on 03/12/21  Please contact further if needed

## 2021-03-22 NOTE — Progress Notes (Signed)
Acute Office Visit  Subjective:    Patient ID: Isabel Meyers, female    DOB: 23-Sep-1970, 50 y.o.   MRN: [id number]  Chief Complaint  Patient presents with   Sore Throat    Pt states she has started having a sore throat and cough since this past weekend    Labs Only    Pt states she would like to have her platelets rechecked, states they went up 16 in 2 days     HPI Patient is in today for sore throat and cough that started on Saturday. She was seen in the office 03/12/21 for sinus infection and was prescribed doxycycline. She states that she never started the antibiotic and she is still having yellow sinus drainage. She has been doing sinus flushes twice a day. Her cough and overall symptoms have improved. Then Saturday, she woke up with a sore throat and cough. She stated that her grandson who lives with her was just diagnosed with strep throat. She would like to take amoxicillin for her sinus infection instead of doxycyline.   She also has been monitoring her platelet recently. They have been WNL, however fluctuating. She had her MPV drawn at an outside lab and states they were elevated and this has been going on for quite some time. She would like her platelets and MPV rechecked today.   Past Medical History:  Diagnosis Date   Anxiety    Irritable bowel    Thyroid disease     Past Surgical History:  Procedure Laterality Date   BREAST SURGERY     NOSE SURGERY     UTERINE FIBROID SURGERY      Family History  Problem Relation Age of Onset   Clotting disorder Mother    Atrial fibrillation Father    Mitral valve prolapse Sister     Social History   Socioeconomic History   Marital status: Divorced    Spouse name: Not on file   Number of children: Not on file   Years of education: Not on file   Highest education level: Not on file  Occupational History   Not on file  Tobacco Use   Smoking status: Never   Smokeless tobacco: Never  Vaping Use   Vaping Use: Never  used  Substance and Sexual Activity   Alcohol use: Never   Drug use: Never   Sexual activity: Not on file  Other Topics Concern   Not on file  Social History Narrative   Not on file   Social Determinants of Health   Financial Resource Strain: Not on file  Food Insecurity: Not on file  Transportation Needs: Not on file  Physical Activity: Not on file  Stress: Not on file  Social Connections: Not on file  Intimate Partner Violence: Not on file    Outpatient Medications Prior to Visit  Medication Sig Dispense Refill   Albuterol Sulfate, sensor, (PROAIR DIGIHALER) 108 (90 Base) MCG/ACT AEPB Inhale 2 puffs into the lungs 4 (four) times daily as needed (shortness of breath). (Patient not taking: Reported on 03/22/2021) 1 each 0   doxycycline (VIBRA-TABS) 100 MG tablet Take 1 tablet (100 mg total) by mouth 2 (two) times daily. (Patient not taking: Reported on 03/22/2021) 20 tablet 0   No facility-administered medications prior to visit.    Allergies  Allergen Reactions   Selsun Blue Dry Scalp [Pyrithione Zinc] Shortness Of Breath   Levaquin [Levofloxacin] Other (See Comments)    "I almost died"   Selenium Sulfide  Salicylic Acid Rash    Review of Systems  Constitutional:  Positive for fatigue. Negative for fever.  HENT:  Positive for congestion, sinus pressure, sinus pain and sore throat. Negative for ear pain.   Respiratory:  Positive for cough.   Cardiovascular: Negative.   Gastrointestinal: Negative.   Genitourinary: Negative.   Skin: Negative.   Neurological:  Positive for headaches.      Objective:    Physical Exam Vitals and nursing note reviewed.  Constitutional:      General: She is not in acute distress.    Appearance: Normal appearance.  HENT:     Head: Normocephalic.     Right Ear: Tympanic membrane, ear canal and external ear normal.     Left Ear: Tympanic membrane, ear canal and external ear normal.     Mouth/Throat:     Mouth: Mucous membranes are  moist.     Pharynx: Posterior oropharyngeal erythema present. No oropharyngeal exudate.  Eyes:     Conjunctiva/sclera: Conjunctivae normal.  Cardiovascular:     Rate and Rhythm: Normal rate and regular rhythm.     Pulses: Normal pulses.     Heart sounds: Normal heart sounds.  Pulmonary:     Effort: Pulmonary effort is normal.     Breath sounds: Normal breath sounds.  Musculoskeletal:     Cervical back: Normal range of motion.  Skin:    General: Skin is warm.  Neurological:     General: No focal deficit present.     Mental Status: She is alert and oriented to person, place, and time.  Psychiatric:        Mood and Affect: Mood normal.        Behavior: Behavior normal.        Thought Content: Thought content normal.        Judgment: Judgment normal.    BP 102/70   Pulse (!) 105   Temp 98.8 F (37.1 C) (Oral)   Wt 147 lb 12.8 oz (67 kg)   LMP 03/11/2021 (Exact Date)   SpO2 98%   BMI 25.21 kg/m  Wt Readings from Last 3 Encounters:  03/22/21 147 lb 12.8 oz (67 kg)  03/12/21 146 lb 12.8 oz (66.6 kg)  09/02/19 142 lb (64.4 kg)    Health Maintenance Due  Topic Date Due   Hepatitis C Screening  Never done   PAP SMEAR-Modifier  Never done   COLONOSCOPY (Pts 45-44yr Insurance coverage will need to be confirmed)  Never done   MAMMOGRAM  Never done    There are no preventive care reminders to display for this patient.   Lab Results  Component Value Date   TSH 0.865 03/12/2021   Lab Results  Component Value Date   WBC 9.5 03/12/2021   HGB 13.2 03/12/2021   HCT 39.5 03/12/2021   MCV 85 03/12/2021   PLT 299 03/12/2021   Lab Results  Component Value Date   NA 137 03/12/2021   K 4.3 03/12/2021   CO2 24 03/12/2021   GLUCOSE 80 03/12/2021   BUN 6 03/12/2021   CREATININE 0.61 03/12/2021   BILITOT 0.5 03/12/2021   ALKPHOS 64 03/12/2021   AST 13 03/12/2021   ALT 9 03/12/2021   PROT 6.4 03/12/2021   ALBUMIN 4.2 03/12/2021   CALCIUM 9.2 03/12/2021   ANIONGAP 8  08/20/2019   EGFR 108 03/12/2021   Lab Results  Component Value Date   CHOL 159 07/14/2019   Lab Results  Component Value Date  HDL 69 07/14/2019   Lab Results  Component Value Date   LDLCALC 83 07/14/2019   Lab Results  Component Value Date   TRIG 35 07/14/2019   Lab Results  Component Value Date   CHOLHDL 2.3 07/14/2019   No results found for: HGBA1C     Assessment & Plan:   Problem List Items Addressed This Visit       Hematopoietic and Hemostatic   History of high platelet count    CBC repeated in office today, shows normal platelets at 287 and MPV is 11.5 which is improved from last check a week ago where it was 12.9. Will continue to monitor routinely as she is acutely sick. Encouraged her to schedule a physical in the next few months when she is feeling better and the labs can be repeated.       Other Visit Diagnoses     Sore throat    -  Primary   Rapid strep negative in office, will send for culture.    Relevant Orders   Rapid Strep screen([facility])   CBC With Differential/Platelet   Acute non-recurrent sinusitis, unspecified location       Ongoing sinus infection, did not start doxcycline. Will treat with amoxicillin to cover strep exposure as well. Can take ibuprofen, warm salt water gargle prn   Relevant Medications   amoxicillin (AMOXIL) 875 MG tablet        Meds ordered this encounter  Medications   amoxicillin (AMOXIL) 875 MG tablet    Sig: Take 1 tablet (875 mg total) by mouth 2 (two) times daily for 10 days.    Dispense:  20 tablet    Refill:  0     Charyl Dancer, NP

## 2021-03-22 NOTE — Assessment & Plan Note (Signed)
CBC repeated in office today, shows normal platelets at 287 and MPV is 11.5 which is improved from last check a week ago where it was 12.9. Will continue to monitor routinely as she is acutely sick. Encouraged her to schedule a physical in the next few months when she is feeling better and the labs can be repeated.

## 2021-03-23 LAB — CBC WITH DIFFERENTIAL/PLATELET
Hematocrit: 35.2 % (ref 34.0–46.6)
Hemoglobin: 12 g/dL (ref 11.1–15.9)
Lymphocytes Absolute: 1.8 10*3/uL (ref 0.7–3.1)
Lymphs: 16 %
MCH: 28.7 pg (ref 26.6–33.0)
MCHC: 34.1 g/dL (ref 31.5–35.7)
MCV: 84 fL (ref 79–97)
MID (Absolute): 0.8 10*3/uL (ref 0.1–1.6)
MID: 8 %
Neutrophils Absolute: 8.4 10*3/uL — ABNORMAL HIGH (ref 1.4–7.0)
Neutrophils: 76 %
Platelets: 287 10*3/uL (ref 150–450)
RBC: 4.18 x10E6/uL (ref 3.77–5.28)
RDW: 15.5 % — ABNORMAL HIGH (ref 11.7–15.4)
WBC: 11 10*3/uL — ABNORMAL HIGH (ref 3.4–10.8)

## 2021-03-24 ENCOUNTER — Ambulatory Visit: Payer: Self-pay

## 2021-03-24 NOTE — Telephone Encounter (Signed)
She should continue the antibiotic. With this new infection, it may get worse before it gets better. She should be feeling better in the next couple of days. She is signed up for mychart and can view lab results there, or we can print them for her if needed. I already called her and gave her the lab results over the phone myself.

## 2021-03-24 NOTE — Telephone Encounter (Signed)
Patient called in to say that she is on antibiotics and that her cough is getting worst and she feel worse than she did when she came in the office. She also states that she is coughing up green phlegm. Also want a copy of her lab results  Can be reached at Ph#  [phone]   Pt. Started Amoxil 2 days ago. States cough is worse. She is now coughing up green mucus. Doesn't know if she should continue the antibiotic, "or what to do." Also wants to know her lab results from her office visit.     Answer Assessment - Initial Assessment Questions 1. INFECTION: "What infection is the antibiotic being given for?"     Cough 2. ANTIBIOTIC: "What antibiotic are you taking" "How many times per day?"     Amoxil 3. DURATION: "When was the antibiotic started?"     2 days ago 4. MAIN CONCERN OR SYMPTOM:  "What is your main concern right now?"     Cough seems worse, coughing up green mucus 5. BETTER-SAME-WORSE: "Are you getting better, staying the same, or getting worse compared to when you first started the antibiotics?" If getting worse, ask: "In what way?"      Cough is worse 6. FEVER: "Do you have a fever?" If Yes, ask: "What is your temperature, how was it measured, and when did it start?"     No 7. SYMPTOMS: "Are there any other symptoms you're concerned about?" If Yes, ask: "When did it start?"     No 8. FOLLOW-UP APPOINTMENT: "Do you have a follow-up appointment with your doctor?"     No  Protocols used: Infection on Antibiotic Follow-up Call-A-[facility]

## 2021-03-24 NOTE — Telephone Encounter (Signed)
Routing to provider to advise patient.

## 2021-03-25 LAB — RAPID STREP SCREEN (MED CTR MEBANE ONLY): Strep Gp A Ag, IA W/Reflex: NEGATIVE

## 2021-03-25 LAB — CULTURE, GROUP A STREP: Strep A Culture: NEGATIVE

## 2021-03-25 NOTE — Telephone Encounter (Signed)
Noted  

## 2021-03-25 NOTE — Telephone Encounter (Signed)
Copied from CRM [id number]. Topic: General - Other >> Mar 24, 2021  6:00 PM Aretta Nip wrote: Reason for CRM: pt  has called back after hours still wanting to resolve, spoke with NT today and was told would get a FU call from office and did not. She is wanting the results from her throat culture as she feels that is important to determine if she was even on the right meds. She waited today for a fu call and did not receive and states that if she does not get a call first of am that she will be coming into the office for an explanations. Pt clearly is feeling worse and states the green fleam and feeling worse with med. Her throat is extremely painful, She has not been on MyChart in a very longtime, does not use and feels assuming she is getting messages on that MyChart in not right. Please call pt back as she is wanting some feedback. [phone]

## 2021-03-25 NOTE — Telephone Encounter (Signed)
Patient was notified of recent results and made aware of Lauren's recommendations. Patient states she is going to stop the antibiotics if she does not get any better. Patient states she is still declining because she doesn't have any infections. Patient states if she continues to decline and not feel better, she will be coming back to the office to see a doctor specifically and she stop her antibiotics. Patient states no one is listening to what she is saying as she called and spoke with a [facility] nurse triage and states no one is answering her question. Patient was given updated results from Lauren. Patient verbalized understanding and has no further questions.

## 2021-03-25 NOTE — Telephone Encounter (Signed)
Please see other telephone encounter regarding this.

## 2021-04-20 ENCOUNTER — Ambulatory Visit: Payer: Self-pay

## 2021-04-20 NOTE — Telephone Encounter (Signed)
Pt was calling in to schedule appt for f/up from ED about costochondritis. Pt states she is in severe pain and hurts when coughs or sneezing and feels like rib cage going to explode. Was seen at [facility] and has been following recommendations they provided but she wants something else done. Pt was scheduled appt by Agent for 04/22/21 and then pt was asking about where to get mammogram done so I advised pt information on [facility] bus and sent a mychart message for location. No other questions/concerns were noted.   Reason for Disposition  Health Information question, no triage required and triager able to answer question  Answer Assessment - Initial Assessment Questions 1. REASON FOR CALL or QUESTION: "What is your reason for calling today?" or "How can I best help you?" or "What question do you have that I can help answer?"     Pt was just wanting appt scheduled, not triage by NT  Protocols used: Information Only Call - No Triage-[facility]

## 2021-04-22 ENCOUNTER — Other Ambulatory Visit: Payer: Self-pay

## 2021-04-22 ENCOUNTER — Telehealth: Payer: Self-pay | Admitting: Nurse Practitioner

## 2021-04-22 ENCOUNTER — Ambulatory Visit (INDEPENDENT_AMBULATORY_CARE_PROVIDER_SITE_OTHER): Payer: Self-pay | Admitting: Nurse Practitioner

## 2021-04-22 ENCOUNTER — Encounter: Payer: Self-pay | Admitting: Nurse Practitioner

## 2021-04-22 VITALS — BP 101/69 | HR 81 | Temp 98.4°F | Wt 145.4 lb

## 2021-04-22 DIAGNOSIS — R0602 Shortness of breath: Secondary | ICD-10-CM

## 2021-04-22 DIAGNOSIS — M940 Chondrocostal junction syndrome [Tietze]: Secondary | ICD-10-CM | POA: Insufficient documentation

## 2021-04-22 DIAGNOSIS — Z1231 Encounter for screening mammogram for malignant neoplasm of breast: Secondary | ICD-10-CM

## 2021-04-22 DIAGNOSIS — R21 Rash and other nonspecific skin eruption: Secondary | ICD-10-CM

## 2021-04-22 MED ORDER — TIZANIDINE HCL 4 MG PO TABS: 4.0000 mg | ORAL_TABLET | Freq: Four times a day (QID) | ORAL | 0 refills | Status: DC | PRN

## 2021-04-22 MED ORDER — PREDNISONE 10 MG PO TABS: 10.0000 mg | ORAL_TABLET | Freq: Every day | ORAL | 0 refills | Status: DC

## 2021-04-22 NOTE — Patient Instructions (Signed)
Apr 22, 2021 Thursday 8:30 AM - 4:00 PM   [facility] [address] [place], [place] [address] Main: [phone]   Call the office back if you have any issues finding this location.

## 2021-04-22 NOTE — Progress Notes (Signed)
Established Patient Office Visit  Subjective:  Patient ID: Isabel Meyers, female    DOB: 08-16-1970  Age: 50 y.o. MRN: [id number]  CC:  Chief Complaint  Patient presents with   Chest Pain    Patient states she was recently hospitalized in the ED for Costochondritis. Patient states it hurts when she coughs, sneezing and breath in. Patient states she prescribed Naxopren to help with pain and discomfort. Patient states she has stopped taking it as it is not helping her and making her sick to her stomach. Patient states she is gasping for air from the pain. Patient states the pain is radiating from right to left. Patient states she feels as if her chest is about to explode when she sneezes.    Rash    Patient states she has noticed a rash on her face on both sides of her face. Patient states she has not tried anything over the counter to help with the rash.    Mammogram    Patient states she would like to discuss the mobile Mammogram as she is self pay.     HPI Isabel Meyers presents for ongoing fatigue and pain in her ribs and under both breasts. The pain is worse with walking, taking deep breaths, and in the morning. Currently rating it as a 8/10 pain. She was seen in [facility] ER and [facility] at the beginning of the month. EKG, chest x-ray, and CT angio were negative. She was given naproxen to take twice a day, however this wasn't helping so she stopped it.   RASH  Duration:  days  Location: face  Itching: no Burning: no Redness: yes Oozing: no Scaling: no Blisters: no Painful: no Fevers: no Change in detergents/soaps/personal care products: no Recent illness: yes Recent travel:no History of same: no Context: stable Alleviating factors:  bentonite clay Treatments attempted:bentonite clay Shortness of breath: yes  Throat/tongue swelling: no Myalgias/arthralgias: no   Past Medical History:  Diagnosis Date   Anxiety    Irritable bowel    Thyroid disease     Past  Surgical History:  Procedure Laterality Date   BREAST SURGERY     NOSE SURGERY     UTERINE FIBROID SURGERY      Family History  Problem Relation Age of Onset   Clotting disorder Mother    Atrial fibrillation Father    Mitral valve prolapse Sister     Social History   Socioeconomic History   Marital status: Divorced    Spouse name: Not on file   Number of children: Not on file   Years of education: Not on file   Highest education level: Not on file  Occupational History   Not on file  Tobacco Use   Smoking status: Never   Smokeless tobacco: Never  Vaping Use   Vaping Use: Never used  Substance and Sexual Activity   Alcohol use: Never   Drug use: Never   Sexual activity: Not on file  Other Topics Concern   Not on file  Social History Narrative   Not on file   Social Determinants of Health   Financial Resource Strain: Not on file  Food Insecurity: Not on file  Transportation Needs: Not on file  Physical Activity: Not on file  Stress: Not on file  Social Connections: Not on file  Intimate Partner Violence: Not on file    Outpatient Medications Prior to Visit  Medication Sig Dispense Refill   Albuterol Sulfate, sensor, (PROAIR DIGIHALER) 108 (90  Base) MCG/ACT AEPB Inhale 2 puffs into the lungs 4 (four) times daily as needed (shortness of breath). (Patient not taking: Reported on 03/22/2021) 1 each 0   No facility-administered medications prior to visit.    Allergies  Allergen Reactions   Selsun Blue Dry Scalp [Pyrithione Zinc] Shortness Of Breath   Levaquin [Levofloxacin] Other (See Comments)    "I almost died"   Selenium Sulfide    Salicylic Acid Rash    ROS Review of Systems  Constitutional:  Positive for fatigue. Negative for fever.  HENT: Negative.    Respiratory:  Positive for shortness of breath (with exertion).   Cardiovascular: Negative.   Musculoskeletal:  Positive for arthralgias (ribs and under both breasts).  Skin:  Positive for rash  (face).     Objective:    Physical Exam Vitals and nursing note reviewed.  Constitutional:      General: She is not in acute distress.    Appearance: Normal appearance.  HENT:     Head: Normocephalic and atraumatic.  Eyes:     Conjunctiva/sclera: Conjunctivae normal.  Cardiovascular:     Rate and Rhythm: Normal rate and regular rhythm.     Pulses: Normal pulses.     Heart sounds: Normal heart sounds.  Pulmonary:     Effort: Pulmonary effort is normal.     Breath sounds: Normal breath sounds.  Musculoskeletal:        General: Tenderness (middle chest and medial/lateral side of ribs) present.     Cervical back: Normal range of motion.  Skin:    General: Skin is warm and dry.     Findings: Erythema (bilateral cheeks) present.  Neurological:     General: No focal deficit present.     Mental Status: She is alert and oriented to person, place, and time.  Psychiatric:        Mood and Affect: Mood normal.        Behavior: Behavior normal.        Thought Content: Thought content normal.        Judgment: Judgment normal.    BP 101/69    Pulse 81    Temp 98.4 F (36.9 C) (Oral)    Wt 145 lb 6.4 oz (66 kg)    SpO2 99%    BMI 24.80 kg/m  Wt Readings from Last 3 Encounters:  04/22/21 145 lb 6.4 oz (66 kg)  03/22/21 147 lb 12.8 oz (67 kg)  03/12/21 146 lb 12.8 oz (66.6 kg)     Health Maintenance Due  Topic Date Due   Pneumococcal Vaccine 5-108 Years old (1 - PCV) Never done   Hepatitis C Screening  Never done   PAP SMEAR-Modifier  Never done   COLONOSCOPY (Pts 45-42yr Insurance coverage will need to be confirmed)  Never done   MAMMOGRAM  Never done    There are no preventive care reminders to display for this patient.  Lab Results  Component Value Date   TSH 0.865 03/12/2021   Lab Results  Component Value Date   WBC 11.0 (H) 03/22/2021   HGB 12.0 03/22/2021   HCT 35.2 03/22/2021   MCV 84 03/22/2021   PLT 287 03/22/2021   Lab Results  Component Value Date   NA  137 03/12/2021   K 4.3 03/12/2021   CO2 24 03/12/2021   GLUCOSE 80 03/12/2021   BUN 6 03/12/2021   CREATININE 0.61 03/12/2021   BILITOT 0.5 03/12/2021   ALKPHOS 64 03/12/2021   AST 13  03/12/2021   ALT 9 03/12/2021   PROT 6.4 03/12/2021   ALBUMIN 4.2 03/12/2021   CALCIUM 9.2 03/12/2021   ANIONGAP 8 08/20/2019   EGFR 108 03/12/2021   Lab Results  Component Value Date   CHOL 159 07/14/2019   Lab Results  Component Value Date   HDL 69 07/14/2019   Lab Results  Component Value Date   LDLCALC 83 07/14/2019   Lab Results  Component Value Date   TRIG 35 07/14/2019   Lab Results  Component Value Date   CHOLHDL 2.3 07/14/2019   No results found for: HGBA1C    Assessment & Plan:   Problem List Items Addressed This Visit       Musculoskeletal and Integument   Costochondritis - Primary    Recent infection for the past few months. Naproxen did not help with pain. Will start prednisone daily. She requests low dose. Can take 1-2 tab daily x7 day. Will also send in tizanidine prn. Follow up in 4 weeks.       Relevant Orders   C-reactive protein   Sed Rate (ESR)   Other Visit Diagnoses     Shortness of breath on exertion       CXR, EKG, CT angio all negative at the ER. Labs WNL. Will refer to cardiology.    Relevant Orders   Ambulatory referral to Cardiology   Rash       Unsure cause at this time. Treating costochondritis with prednsione which should help rash as well. F/U if not improving.   Encounter for screening mammogram for malignant neoplasm of breast       Mammogram ordered and information given on mobile bus.    Relevant Orders   MM 3D SCREEN BREAST BILATERAL       Meds ordered this encounter  Medications   predniSONE (DELTASONE) 10 MG tablet    Sig: Take 1 tablet (10 mg total) by mouth daily with breakfast. May take 2 tablets daily if tolerating well    Dispense:  14 tablet    Refill:  0   tiZANidine (ZANAFLEX) 4 MG tablet    Sig: Take 1 tablet (4  mg total) by mouth every 6 (six) hours as needed for muscle spasms.    Dispense:  10 tablet    Refill:  0    Follow-up: Return in about 4 weeks (around 05/20/2021) for costochondritis.   A total of 30 minutes were spent on this encounter today. When total time is documented, this includes both the face-to-face and non-face-to-face time personally spent before, during and after the visit on the date of the encounter.   Charyl Dancer, NP

## 2021-04-22 NOTE — Telephone Encounter (Signed)
Copied from CRM [id number]. Topic: General - Other >> Apr 22, 2021 12:41 PM Pawlus, Maxine Glenn A wrote: Reason for CRM: Pt stated she went to get a free mammogram in [place] but they wanted her to pay over $200, pt called back to see if there were any other options / places to get a discounted mammogram done at.

## 2021-04-22 NOTE — Telephone Encounter (Signed)
[facility] Department   [address] [place], [place] [phone]  She can try at this health department.

## 2021-04-22 NOTE — Assessment & Plan Note (Signed)
Recent infection for the past few months. Naproxen did not help with pain. Will start prednisone daily. She requests low dose. Can take 1-2 tab daily x7 day. Will also send in tizanidine prn. Follow up in 4 weeks.

## 2021-04-22 NOTE — Telephone Encounter (Signed)
Routing to provider to advise.  

## 2021-04-23 LAB — C-REACTIVE PROTEIN: CRP: <1 mg/L (ref 0–10)

## 2021-04-23 LAB — SEDIMENTATION RATE: Sed Rate: 2 mm/hr (ref 0–40)

## 2021-04-27 NOTE — Telephone Encounter (Signed)
Called patient left voicemail with details including information patient was requesting.

## 2021-05-06 ENCOUNTER — Inpatient Hospital Stay: Admission: RE | Admit: 2021-05-06 | Payer: Self-pay | Source: Ambulatory Visit

## 2021-05-20 ENCOUNTER — Ambulatory Visit: Payer: Self-pay | Admitting: Internal Medicine

## 2021-05-25 ENCOUNTER — Ambulatory Visit: Payer: Self-pay | Admitting: Internal Medicine

## 2021-05-28 ENCOUNTER — Other Ambulatory Visit: Payer: Self-pay | Admitting: Nurse Practitioner

## 2021-05-28 ENCOUNTER — Ambulatory Visit
Admission: RE | Admit: 2021-05-28 | Discharge: 2021-05-28 | Disposition: A | Discharge status: Home / Self Care | Payer: No Typology Code available for payment source | Source: Ambulatory Visit | Attending: Nurse Practitioner | Admitting: Nurse Practitioner

## 2021-05-28 ENCOUNTER — Other Ambulatory Visit: Payer: Self-pay | Admitting: *Deleted

## 2021-05-28 ENCOUNTER — Other Ambulatory Visit: Payer: Self-pay

## 2021-05-28 DIAGNOSIS — Z1231 Encounter for screening mammogram for malignant neoplasm of breast: Secondary | ICD-10-CM

## 2021-05-31 ENCOUNTER — Other Ambulatory Visit: Payer: Self-pay | Admitting: Nurse Practitioner

## 2021-05-31 DIAGNOSIS — R928 Other abnormal and inconclusive findings on diagnostic imaging of breast: Secondary | ICD-10-CM

## 2021-06-15 ENCOUNTER — Other Ambulatory Visit: Payer: Self-pay

## 2021-06-15 ENCOUNTER — Ambulatory Visit: Payer: Self-pay | Admitting: *Deleted

## 2021-06-15 VITALS — BP 118/72 | Wt 147.0 lb

## 2021-06-15 DIAGNOSIS — Z1239 Encounter for other screening for malignant neoplasm of breast: Secondary | ICD-10-CM

## 2021-06-15 DIAGNOSIS — Z1211 Encounter for screening for malignant neoplasm of colon: Secondary | ICD-10-CM

## 2021-06-15 NOTE — Patient Instructions (Signed)
Explained breast self awareness with [facility]. Patient did not need a Pap smear today due to last Pap smear and HPV typing was 10/22/2020. Let her know BCCCP will cover Pap smears and HPV typing every 5 years unless has a history of abnormal Pap smears. Referred patient to the [facility] for a left breast diagnostic mammogram per recommendation. Appointment scheduled Wednesday, June 16, 2021 at 1330. Patient aware of appointment and will be there. Isabel Meyers verbalized understanding.  Jeanell Mangan, Kathaleen Maser, RN 2:26 PM

## 2021-06-15 NOTE — Progress Notes (Signed)
[name] is a 51 y.o. female who presents to [facility] clinic today with no complaints. Patient referred to [facility] by the [facility] for a left breast diagnostic mammogram due to having a screening mammogram completed 05/28/2021 that additional imaging of the left breast was recommended for follow up.   Pap Smear: Pap smear not completed today. Last Pap smear was 10/22/2020 at [facility] clinic in [facility] and was normal with negative HPV. Per patient has no history of an abnormal Pap smear. Last Pap smear result is available in [facility].   Physical exam: Breasts Breasts symmetrical. No skin abnormalities bilateral breasts. No nipple retraction bilateral breasts. No nipple discharge bilateral breasts. No lymphadenopathy. No lumps palpated bilateral breasts. Complaints of left outer breast tenderness on exam.      MM 3D SCREEN BREAST W/IMPLANT BILATERAL  Result Date: 05/28/2021 CLINICAL DATA:  Screening. EXAM: DIGITAL SCREENING BILATERAL MAMMOGRAM WITH IMPLANTS, CAD AND TOMOSYNTHESIS TECHNIQUE: Bilateral screening digital craniocaudal and mediolateral oblique mammograms were obtained. Bilateral screening digital breast tomosynthesis was performed. The images were evaluated with computer-aided detection. Standard and/or implant displaced views were performed. COMPARISON:  None. ACR Breast Density Category c: The breast tissue is heterogeneously dense, which may obscure small masses. FINDINGS: The patient has retropectoral saline implants. In the left breast, a possible focal asymmetry warrants further evaluation. In the right breast, no suspicious masses or malignant type calcifications are identified. IMPRESSION: Further evaluation is suggested for possible focal asymmetry in the left breast. RECOMMENDATION: Diagnostic mammogram and possibly ultrasound of the left breast. (Code:FI-L-41M) The patient will be contacted regarding the findings, and  additional imaging will be scheduled. BI-RADS CATEGORY  0: Incomplete. Need additional imaging evaluation and/or prior mammograms for comparison. Electronically Signed   By: Isabel Meyers M.D.   On: 05/28/2021 16:42    Pelvic/Bimanual Pap is not indicated today per BCCCP guidelines.   Smoking History: Patient has never smoked.   Patient Navigation: Patient education provided. Access to services provided for patient through BCCCP program.   Colorectal Cancer Screening: Per patient has a history of having a colonoscopy completed 7 years ago. FIT Test given to patient to complete. No complaints today.    Breast and Cervical Cancer Risk Assessment: Patient does not have family history of breast cancer, known genetic mutations, or radiation treatment to the chest before age 6. Patient does not have history of cervical dysplasia, immunocompromised, or DES exposure in-utero.  Risk Assessment     Risk Scores       06/15/2021   Last edited by: Isabel Meyers, CMA   5-year risk: 0.8 %   Lifetime risk: 6.9 %            A: BCCCP exam without pap smear No complaints.  P: Referred patient to the [facility] for a left breast diagnostic mammogram per recommendation. Appointment scheduled Wednesday, June 16, 2021 at 1330.  Isabel Heidelberg, RN 06/15/2021 2:26 PM

## 2021-06-16 ENCOUNTER — Other Ambulatory Visit: Payer: No Typology Code available for payment source

## 2021-06-25 ENCOUNTER — Ambulatory Visit
Admission: RE | Admit: 2021-06-25 | Discharge: 2021-06-25 | Disposition: A | Discharge status: Home / Self Care | Payer: No Typology Code available for payment source | Source: Ambulatory Visit | Attending: Nurse Practitioner | Admitting: Nurse Practitioner

## 2021-06-25 ENCOUNTER — Other Ambulatory Visit: Payer: Self-pay

## 2021-06-25 DIAGNOSIS — R928 Other abnormal and inconclusive findings on diagnostic imaging of breast: Secondary | ICD-10-CM

## 2022-02-26 IMAGING — CR DG CHEST 2V
1 series · 3 of 3 positions shown · non-contrast
Comparison: None.

CLINICAL DATA: Shortness of breath, concern for infiltrate versus
edema

EXAM:
CHEST - 2 VIEW

[Series 1: dg chest 2 view · 0.14mm/px · 3 of 3 slices shown]
[im 1/3]
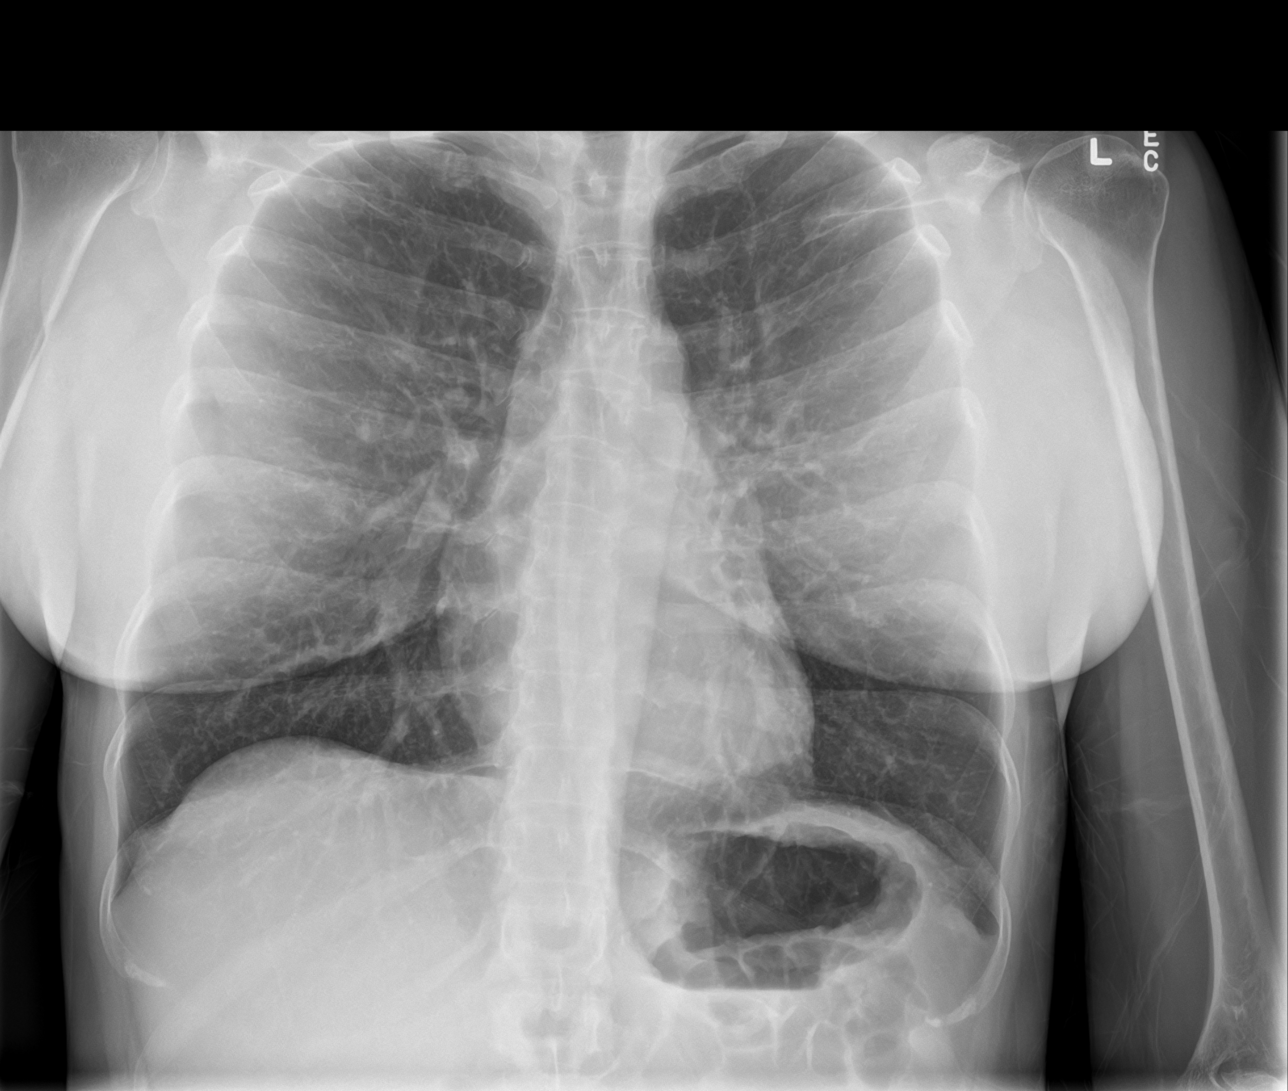
[im 2/3]
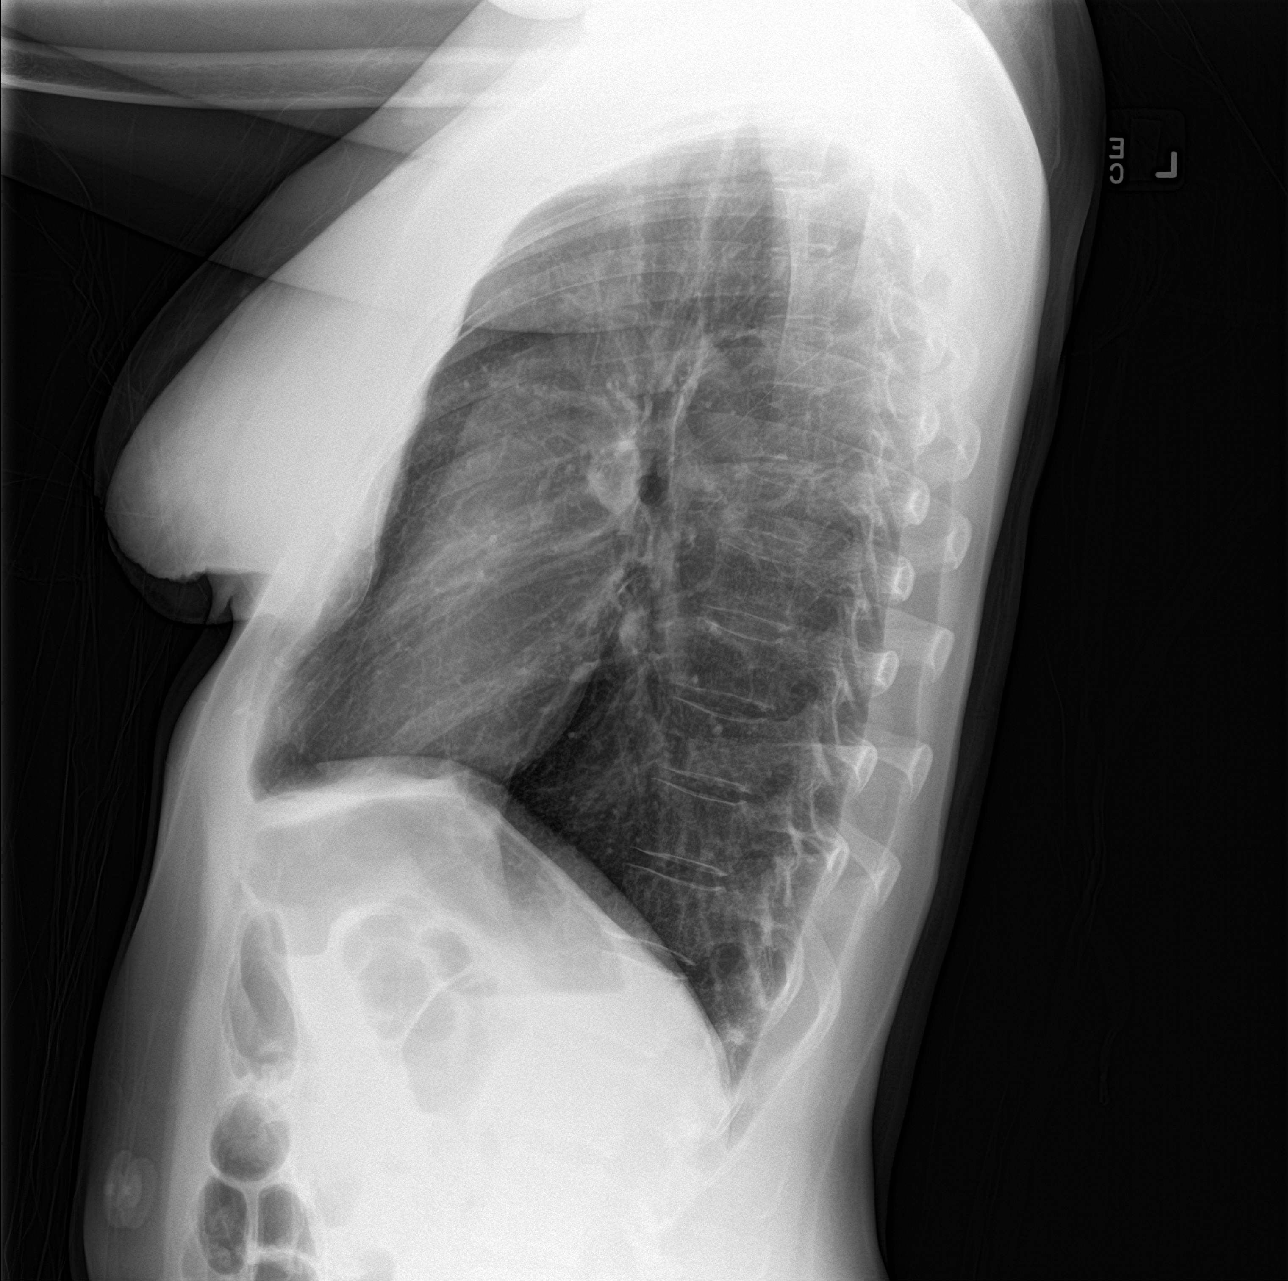
[im 3/3]
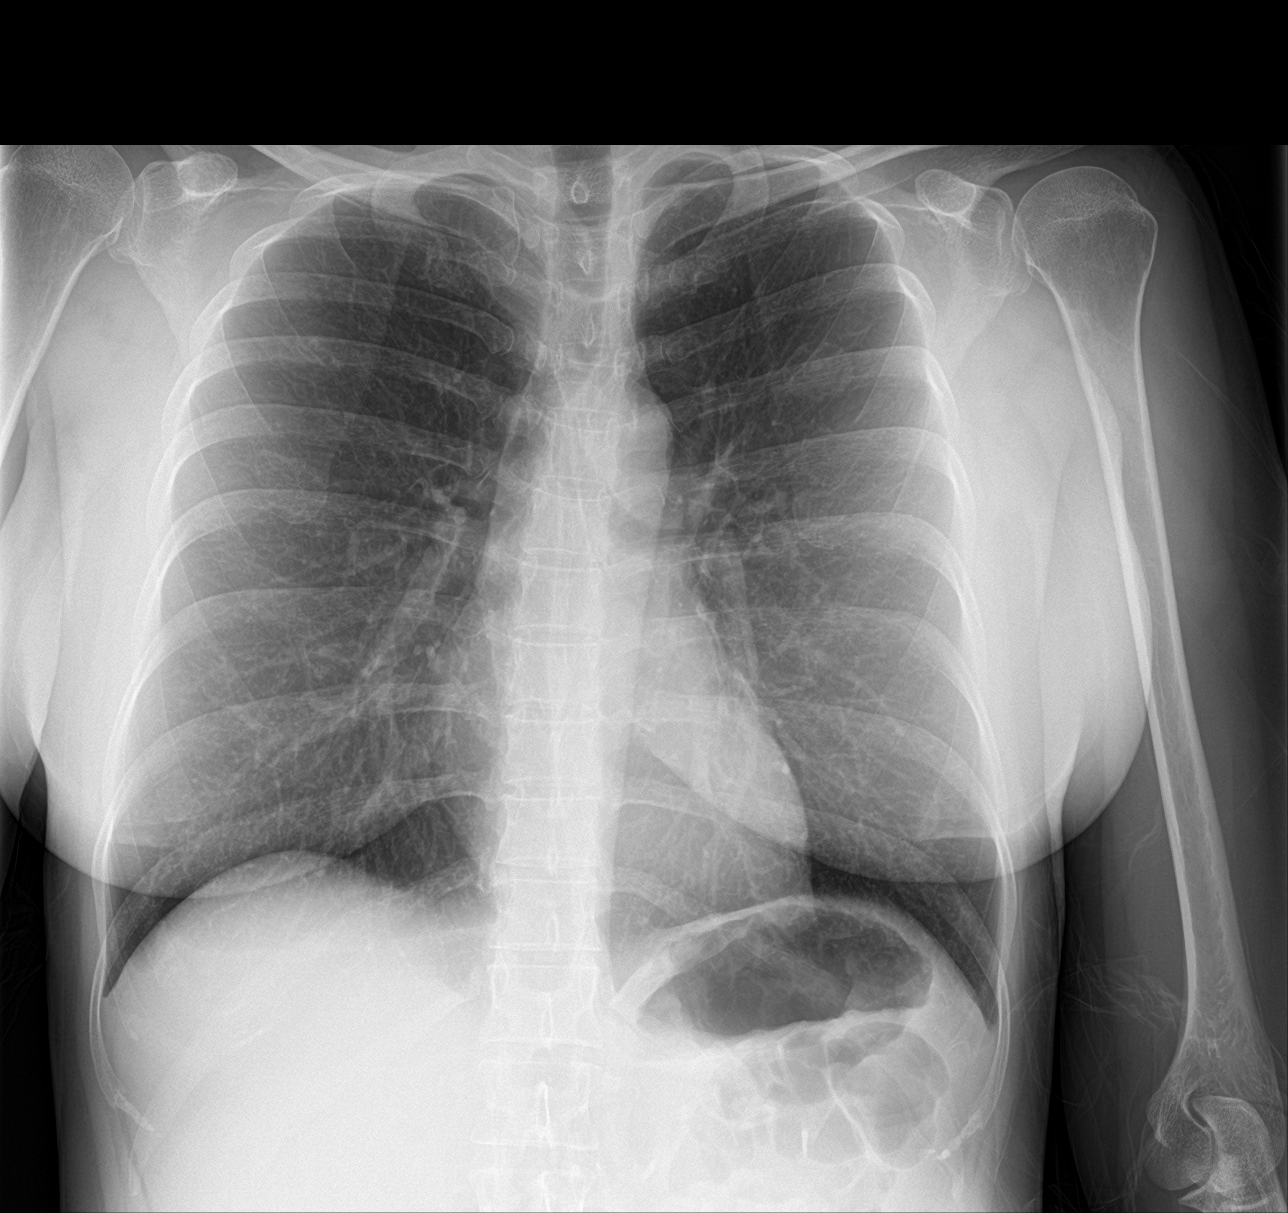

[3 of 3 positions shown; findings below may reference images not displayed]

FINDINGS: No consolidation, features of edema, pneumothorax, or effusion. The
cardiomediastinal contours are unremarkable. No acute osseous or
soft tissue abnormality. Bilateral breast prostheses result in
increased attenuation of the upper lung parenchyma.
IMPRESSION: No acute cardiopulmonary findings.

## 2022-04-28 ENCOUNTER — Ambulatory Visit (INDEPENDENT_AMBULATORY_CARE_PROVIDER_SITE_OTHER): Payer: Self-pay | Admitting: Nurse Practitioner

## 2022-04-28 ENCOUNTER — Encounter: Payer: Self-pay | Admitting: Nurse Practitioner

## 2022-04-28 VITALS — BP 123/81 | HR 91 | Temp 98.1°F | Wt 152.6 lb

## 2022-04-28 DIAGNOSIS — R82998 Other abnormal findings in urine: Secondary | ICD-10-CM

## 2022-04-28 DIAGNOSIS — R0602 Shortness of breath: Secondary | ICD-10-CM

## 2022-04-28 DIAGNOSIS — R531 Weakness: Secondary | ICD-10-CM

## 2022-04-28 DIAGNOSIS — Z862 Personal history of diseases of the blood and blood-forming organs and certain disorders involving the immune mechanism: Secondary | ICD-10-CM

## 2022-04-28 LAB — URINALYSIS, ROUTINE W REFLEX MICROSCOPIC
Bilirubin, UA: NEGATIVE
Glucose, UA: NEGATIVE
Ketones, UA: NEGATIVE
Leukocytes,UA: NEGATIVE
Nitrite, UA: NEGATIVE
Protein,UA: NEGATIVE
Specific Gravity, UA: <1.005 — ABNORMAL LOW (ref 1.005–1.030)
Urobilinogen, Ur: 0.2 mg/dL (ref 0.2–1.0)
pH, UA: 6 (ref 5.0–7.5)

## 2022-04-28 LAB — MICROSCOPIC EXAMINATION
Bacteria, UA: NONE SEEN
Epithelial Cells (non renal): NONE SEEN /hpf (ref 0–10)

## 2022-04-28 NOTE — Progress Notes (Signed)
BP 123/81   Pulse 91   Temp 98.1 F (36.7 C) (Oral)   Wt 152 lb 9.6 oz (69.2 kg)   SpO2 98%   BMI 26.03 kg/m    Subjective:    Patient ID: Isabel Meyers, female    DOB: [birth year], 52 y.o.   MRN: [id number]  HPI: Isabel Meyers is a 52 y.o. female  Chief Complaint  Patient presents with   Chest Pain   Patient states she was sleeping on Thursday/Friday and woken up at 3 in the morning and her radiation detector went off and she called the police and the alarm went off.  She also had some hummus and felt like it may have made her sick.  She took activated charcoal and drank milk but ended up getting sick with diarrhea.  The diarrhea subsided.  States her face is turning red then going pale.  She has gone to a hotel due to her home having other toxins.  She states her heart felt like it was squeezing and weak.  Her breathing was shallow.  She rested since then.  She is also having leg pain for 3 days (it has now resolved) down in her deep veins and thighs.  Her urine was dark yellow for 4 days even though she was drinking water.  Her urine is back to normal.  Her pain in her organs has resolved.     Relevant past medical, surgical, family and social history reviewed and updated as indicated. Interim medical history since our last visit reviewed. Allergies and medications reviewed and updated.  Review of Systems  Constitutional:        Flushed  Gastrointestinal:  Positive for diarrhea.  Genitourinary:        Dark colored urine  Musculoskeletal:        Leg pain    Per HPI unless specifically indicated above     Objective:    BP 123/81   Pulse 91   Temp 98.1 F (36.7 C) (Oral)   Wt 152 lb 9.6 oz (69.2 kg)   SpO2 98%   BMI 26.03 kg/m   Wt Readings from Last 3 Encounters:  04/28/22 152 lb 9.6 oz (69.2 kg)  06/15/21 147 lb (66.7 kg)  04/22/21 145 lb 6.4 oz (66 kg)    Physical Exam Vitals and nursing note reviewed.  Constitutional:      General: She is not  in acute distress.    Appearance: Normal appearance. She is normal weight. She is not ill-appearing, toxic-appearing or diaphoretic.  HENT:     Head: Normocephalic.     Right Ear: External ear normal.     Left Ear: External ear normal.     Nose: Nose normal.     Mouth/Throat:     Mouth: Mucous membranes are moist.     Pharynx: Oropharynx is clear.  Eyes:     General:        Right eye: No discharge.        Left eye: No discharge.     Extraocular Movements: Extraocular movements intact.     Conjunctiva/sclera: Conjunctivae normal.     Pupils: Pupils are equal, round, and reactive to light.  Cardiovascular:     Rate and Rhythm: Normal rate and regular rhythm.     Heart sounds: No murmur heard. Pulmonary:     Effort: Pulmonary effort is normal. No respiratory distress.     Breath sounds: Normal breath sounds. No wheezing or rales.  Musculoskeletal:     Cervical back: Normal range of motion and neck supple.  Skin:    General: Skin is warm and dry.     Capillary Refill: Capillary refill takes less than 2 seconds.  Neurological:     General: No focal deficit present.     Mental Status: She is alert and oriented to person, place, and time. Mental status is at baseline.  Psychiatric:        Mood and Affect: Mood normal.        Behavior: Behavior normal.        Thought Content: Thought content normal.        Judgment: Judgment normal.     Results for orders placed or performed in visit on 04/22/21  C-reactive protein  Result Value Ref Range   CRP <1 0 - 10 mg/L  Sed Rate (ESR)  Result Value Ref Range   Sed Rate 2 0 - 40 mm/hr      Assessment & Plan:   Problem List Items Addressed This Visit       Hematopoietic and Hemostatic   History of high platelet count   Relevant Orders   CBC w/Diff   Iron, TIBC and Ferritin Panel     Other   Shortness of breath   Relevant Orders   Comp Met (CMET)   CBC w/Diff   TSH   Other Visit Diagnoses     Weakness    -  Primary    Suspect symptoms were related to viral illness.  Discussed with patient during visit. Will check labs at visit today to rule out infection.   Dark urine       Relevant Orders   Urinalysis, Routine w reflex microscopic        Follow up plan: Return if symptoms worsen or fail to improve.

## 2022-04-29 LAB — CBC WITH DIFFERENTIAL/PLATELET
Basophils Absolute: 0.1 10*3/uL (ref 0.0–0.2)
Basos: 1 %
EOS (ABSOLUTE): 0.1 10*3/uL (ref 0.0–0.4)
Eos: 1 %
Hematocrit: 41.1 % (ref 34.0–46.6)
Hemoglobin: 14.2 g/dL (ref 11.1–15.9)
Immature Grans (Abs): 0 10*3/uL (ref 0.0–0.1)
Immature Granulocytes: 0 %
Lymphocytes Absolute: 2.4 10*3/uL (ref 0.7–3.1)
Lymphs: 33 %
MCH: 30 pg (ref 26.6–33.0)
MCHC: 34.5 g/dL (ref 31.5–35.7)
MCV: 87 fL (ref 79–97)
Monocytes Absolute: 0.5 10*3/uL (ref 0.1–0.9)
Monocytes: 7 %
Neutrophils Absolute: 4.3 10*3/uL (ref 1.4–7.0)
Neutrophils: 58 %
Platelets: 235 10*3/uL (ref 150–450)
RBC: 4.74 x10E6/uL (ref 3.77–5.28)
RDW: 12.8 % (ref 11.7–15.4)
WBC: 7.2 10*3/uL (ref 3.4–10.8)

## 2022-04-29 LAB — TSH: TSH: 1.03 u[IU]/mL (ref 0.450–4.500)

## 2022-04-29 LAB — COMPREHENSIVE METABOLIC PANEL
ALT: 24 IU/L (ref 0–32)
AST: 21 IU/L (ref 0–40)
Albumin/Globulin Ratio: 2.4 — ABNORMAL HIGH (ref 1.2–2.2)
Albumin: 4.8 g/dL (ref 3.8–4.9)
Alkaline Phosphatase: 79 IU/L (ref 44–121)
BUN/Creatinine Ratio: 16 (ref 9–23)
BUN: 10 mg/dL (ref 6–24)
Bilirubin Total: 0.5 mg/dL (ref 0.0–1.2)
CO2: 25 mmol/L (ref 20–29)
Calcium: 9.7 mg/dL (ref 8.7–10.2)
Chloride: 101 mmol/L (ref 96–106)
Creatinine, Ser: 0.64 mg/dL (ref 0.57–1.00)
Globulin, Total: 2 g/dL (ref 1.5–4.5)
Glucose: 80 mg/dL (ref 70–99)
Potassium: 4 mmol/L (ref 3.5–5.2)
Sodium: 141 mmol/L (ref 134–144)
Total Protein: 6.8 g/dL (ref 6.0–8.5)
eGFR: 106 mL/min/{1.73_m2} (ref >59)

## 2022-04-29 LAB — IRON,TIBC AND FERRITIN PANEL
Ferritin: 47 ng/mL (ref 15–150)
Iron Saturation: 46 % (ref 15–55)
Iron: 172 ug/dL — ABNORMAL HIGH (ref 27–159)
Total Iron Binding Capacity: 377 ug/dL (ref 250–450)
UIBC: 205 ug/dL (ref 131–425)

## 2022-04-29 NOTE — Progress Notes (Signed)
Hi Demeshia.  Your lab work is unremarkable.  You likely had a virus that needs to runs its course.  Hope you feel better soon.

## 2022-07-20 ENCOUNTER — Telehealth: Payer: Self-pay

## 2022-07-20 NOTE — Telephone Encounter (Signed)
-----   Message from Georgina Peer, [place] sent at 07/18/2022  9:56 AM EDT ----- Patient needs TOC call completed please.

## 2022-07-20 NOTE — Transitions of Care (Post Inpatient/ED Visit) (Signed)
   07/20/2022  Name: Isabel Meyers MRN: [id number] DOB: [birth year]  Today's TOC FU Call Status: Today's TOC FU Call Status:: Unsuccessul Call (1st Attempt) Unsuccessful Call (1st Attempt) Date: 07/20/22  Attempted to reach the patient regarding the most recent Inpatient/ED visit.  Follow Up Plan: Additional outreach attempts will be made to reach the patient to complete the Transitions of Care (Post Inpatient/ED visit) call.   Signature Irena Reichmann, [place]

## 2022-08-04 ENCOUNTER — Ambulatory Visit: Payer: Self-pay | Admitting: Physician Assistant

## 2022-08-04 ENCOUNTER — Telehealth: Payer: Self-pay | Admitting: Family Medicine

## 2022-08-04 NOTE — Telephone Encounter (Signed)
Pt came to the office as a walk-in just wanted to have he O2 checked and BP checked without having an appointment.  She stated that she had missed several appointments and just needed to have those two things checked out since she had missed the appointments.  I found an appointment for the pt and got her scheduled and checked in and after handing her the screening form she decided that she did not want to be seen.  I assured her that she did not have to do the screening form (Patient Health Questionnaire and General Anxiety Disorder form). She stated that she was feeling better in the car and did not know why she came in to the office.  She kept making mention that we were so busy, but there was no one behind her.  Pt stated please just cancel my appointment I just don't need to be seen b/c I have my daughter and granddaughter at home and I don't have the time to be seen.  She did state that she will go to [facility] if she needed to if she gets that feeling come back over her.   Pt came back in to the office to get a ROI (Release of Information) form to have her records transferred she is not sure as to what facility she will be going to.  Pt stated that she was with a NP and when she (NP) left they gave her to a MD that she never laid eyes on and now she don't think she is a pt here.  I assured her that she had been seen by another NP at the beginning of the year (January) and she stated that she had not.  I offered her a copy of the visit and she stated I haven't been seen here since the NP seen me back a while ago.  I tried all that I could to appease her but she was not accepting any guidance from me. Pt kept apologizing when she came back in for being so confused about what she wanted to do and wanted me to assure her that her appointment was cancelled and I assured her that it was.  Nothing further was needed.

## 2023-01-15 ENCOUNTER — Other Ambulatory Visit: Payer: Self-pay

## 2023-01-15 ENCOUNTER — Emergency Department: Payer: Medicaid Other

## 2023-01-15 ENCOUNTER — Emergency Department
Admission: EM | Admit: 2023-01-15 | Discharge: 2023-01-15 | Disposition: A | Discharge status: Home / Self Care | Payer: Medicaid Other | Attending: Emergency Medicine | Admitting: Emergency Medicine

## 2023-01-15 DIAGNOSIS — R600 Localized edema: Secondary | ICD-10-CM | POA: Insufficient documentation

## 2023-01-15 DIAGNOSIS — M7989 Other specified soft tissue disorders: Secondary | ICD-10-CM | POA: Diagnosis present

## 2023-01-15 DIAGNOSIS — R0602 Shortness of breath: Secondary | ICD-10-CM | POA: Insufficient documentation

## 2023-01-15 DIAGNOSIS — E871 Hypo-osmolality and hyponatremia: Secondary | ICD-10-CM | POA: Insufficient documentation

## 2023-01-15 LAB — CBC
HCT: 38.6 % (ref 36.0–46.0)
Hemoglobin: 13 g/dL (ref 12.0–15.0)
MCH: 29.5 pg (ref 26.0–34.0)
MCHC: 33.7 g/dL (ref 30.0–36.0)
MCV: 87.5 fL (ref 80.0–100.0)
Platelets: 206 10*3/uL (ref 150–400)
RBC: 4.41 MIL/uL (ref 3.87–5.11)
RDW: 13.2 % (ref 11.5–15.5)
WBC: 7.4 10*3/uL (ref 4.0–10.5)
nRBC: 0 % (ref 0.0–0.2)

## 2023-01-15 LAB — BASIC METABOLIC PANEL
Anion gap: 12 (ref 5–15)
BUN: 9 mg/dL (ref 6–20)
CO2: 22 mmol/L (ref 22–32)
Calcium: 9.1 mg/dL (ref 8.9–10.3)
Chloride: 98 mmol/L (ref 98–111)
Creatinine, Ser: 0.67 mg/dL (ref 0.44–1.00)
GFR, Estimated: >60 mL/min (ref >60)
Glucose, Bld: 95 mg/dL (ref 70–99)
Potassium: 3.8 mmol/L (ref 3.5–5.1)
Sodium: 132 mmol/L — ABNORMAL LOW (ref 135–145)

## 2023-01-15 LAB — D-DIMER, QUANTITATIVE: D-Dimer, Quant: 0.5 ug/mL-FEU (ref 0.00–0.50)

## 2023-01-15 LAB — BRAIN NATRIURETIC PEPTIDE: B Natriuretic Peptide: 14.9 pg/mL (ref 0.0–100.0)

## 2023-01-15 NOTE — ED Provider Notes (Signed)
[facility] Emergency Department Provider Note     Event Date/Time   First MD Initiated Contact with Patient 01/15/23 1758     (approximate)   History   Leg Swelling   HPI  Isabel Meyers is a 52 y.o. female with a history of anxiety who presents to the ED with complaint of bilateral lower leg swelling x 1 week and shortness of breath x 4 days.  Patient reports shortness of breath is not severe but is noticed.  Patient denies taking any new medications. She has more concerns with her oxygen level being low.  Patient reports no recent trauma, surgeries or prolonged immobility.  Patient denies chest pain.  Denies fever, pain with ambulation.     Physical Exam   Triage Vital Signs: ED Triage Vitals [01/15/23 1744]  Encounter Vitals Group     BP (!) 140/92     Systolic BP Percentile      Diastolic BP Percentile      Pulse Rate (!) 102     Resp 18     Temp 98 F (36.7 C)     Temp src      SpO2 97 %     Weight      Height      Head Circumference      Peak Flow      Pain Score      Pain Loc      Pain Education      Exclude from Growth Chart     Most recent vital signs: Vitals:   01/15/23 1744  BP: (!) 140/92  Pulse: (!) 102  Resp: 18  Temp: 98 F (36.7 C)  SpO2: 97%    General: Alert and oriented. INAD.  Skin:  Warm, dry and intact. No rashes or lesions noted.     Head:  NCAT.  Eyes:  PERRLA. EOMI.  Nose:   Mucosa is moist. No rhinorrhea. CV:  Good peripheral perfusion. RRR.  Bilateral lower leg swelling.  No pitting edema.  Neurovascular status intact. RESP:  Normal effort. LCTAB. No retractions.  MSK:   Full ROM in all joints. No swelling, deformity or tenderness.  NEURO: Cranial nerves intact. No focal deficits. Sensation and motor function intact. Gait is steady.   ED Results / Procedures / Treatments   Labs (all labs ordered are listed, but only abnormal results are displayed) Labs Reviewed  BASIC METABOLIC PANEL -  Abnormal; Notable for the following components:      Result Value   Sodium 132 (*)    All other components within normal limits  CBC  BRAIN NATRIURETIC PEPTIDE  D-DIMER, QUANTITATIVE   RADIOLOGY  I personally viewed and evaluated these images as part of my medical decision making, as well as reviewing the written report by the radiologist.  ED Provider Interpretation: Ultrasound reveals no DVT.  Chest x-ray is normal.  US Venous Img Lower Bilateral  Result Date: 01/15/2023 CLINICAL DATA:  swelling EXAM: BILATERAL LOWER EXTREMITY VENOUS DOPPLER ULTRASOUND TECHNIQUE: Gray-scale sonography with graded compression, as well as color Doppler and duplex ultrasound were performed to evaluate the lower extremity deep venous systems from the level of the common femoral vein and including the common femoral, femoral, profunda femoral, popliteal and calf veins including the posterior tibial, peroneal and gastrocnemius veins when visible. The superficial great saphenous vein was also interrogated. Spectral Doppler was utilized to evaluate flow at rest and with distal augmentation maneuvers in the common femoral,  femoral and popliteal veins. COMPARISON:  Chest XR, concurrent and 07/13/2019. CTA PE, 07/13/2019. FINDINGS: RIGHT LOWER EXTREMITY VENOUS Normal compressibility of the RIGHT common femoral, superficial femoral, and popliteal veins, as well as the visualized calf veins. Visualized portions of profunda femoral vein and great saphenous vein unremarkable. No filling defects to suggest DVT on grayscale or color Doppler imaging. Doppler waveforms show normal direction of venous flow, normal respiratory plasticity and response to augmentation. OTHER No evidence of superficial thrombophlebitis or abnormal fluid collection. Limitations: none LEFT LOWER EXTREMITY VENOUS Normal compressibility of the LEFT common femoral, superficial femoral, and popliteal veins, as well as the visualized calf veins. Visualized  portions of profunda femoral vein and great saphenous vein unremarkable. No filling defects to suggest DVT on grayscale or color Doppler imaging. Doppler waveforms show normal direction of venous flow, normal respiratory plasticity and response to augmentation. OTHER No evidence of superficial thrombophlebitis or abnormal fluid collection. Limitations: none IMPRESSION: No evidence of femoropopliteal DVT or superficial thrombophlebitis within either lower extremity. Roanna Banning, MD [facility] Radiology Electronically Signed   By: Roanna Banning M.D.   On: 01/15/2023 19:19   DG Chest 2 View  Result Date: 01/15/2023 CLINICAL DATA:  dyspnea EXAM: CHEST - 2 VIEW COMPARISON:  Chest XR and CTA chest, 07/13/2018. FINDINGS: Cardiomediastinal silhouette is within normal limits. Lungs are well inflated. No focal consolidation or mass. No pleural effusion or pneumothorax. No acute displaced fracture. IMPRESSION: No acute cardiopulmonary process. Electronically Signed   By: Roanna Banning M.D.   On: 01/15/2023 19:17    PROCEDURES:  Critical Care performed: No  Procedures  MEDICATIONS ORDERED IN ED: Medications - No data to display  IMPRESSION / MDM / ASSESSMENT AND PLAN / ED COURSE  I reviewed the triage vital signs and the nursing notes.                               52 y.o. female presents to the emergency department for evaluation and treatment of bilateral leg swelling. See HPI for further details. Vital signs and physical exam are pertinent for bilateral leg swelling and tachycardic.   Differential diagnosis includes, but is not limited to DVT, CHF, PE, pneumonia, venous insufficiency  Patient's presentation is most consistent with acute complicated illness / injury requiring diagnostic workup.  Patient is alert and oriented.  She is mildly tachycardic on initial assessment.  Oxygen saturation is normal on initial vital signs.  Further workup is  indicated.  BMP, D-dimer, chest x-ray and basic labs obtained.  Labs are reassuring.  Mildly low sodium of 132.  BMP 14.9.  Chest x-ray and ultrasound venous of bilateral lower legs are reassuring. PERC rule applied.  D-dimer 0.5.  Low suspicion for PE.  Patient walked through lab values and diagnostic exams thoroughly.  Cardiology referral for bilateral lower leg edema for patient request.  Patient is in stable condition for discharge home.  Patient's presentation is most likely consistent with venous insufficiency.  She is encouraged to wear stocking compressions and elevate legs above heart level.  She is to follow-up with her [facility] care for further evaluation as needed.  ED precautions discussed. Patient verbalizes understanding. All questions and concerns were addressed during ED visit.     FINAL CLINICAL IMPRESSION(S) / ED DIAGNOSES   Final diagnoses:  Bilateral lower extremity edema   Rx / DC Orders   ED Discharge Orders  Ordered    Ambulatory referral to Cardiology       Comments: If you have not heard from the Cardiology office within the next 72 hours please call [phone].   01/15/23 2011           Note:  This document was prepared using Dragon voice recognition software and may include unintentional dictation errors.    Romeo Apple, Brantly Kalman A, PA-C 01/16/23 0001    Corena Herter, MD 01/16/23 2237

## 2023-01-15 NOTE — ED Triage Notes (Signed)
Pt comes with c/o bilateral leg swelling. Pt states she has been sitting more than normal. Pt states she feels her O2 has been dropping. Pt states this all started week ago and now O2 in last 4 days.  Pt states no sob.  Pt states she also had some numbness to side of face. Pt states that resolved.

## 2023-01-15 NOTE — ED Notes (Signed)
Taking over care of pt at this time

## 2023-01-15 NOTE — ED Provider Notes (Addendum)
Shared visit  Presents to the emergency department with lower leg swelling and shortness of breath.  No episodes of chest pain.  No history of DVT or PE.  Tachycardic on arrival.  Low risk Wells criteria.  Will add on a D-dimer.  No significant anemia or leukocytosis.  No signs of an infectious process.  Chest x-ray with no signs of pneumonia.  No signs of heart failure.  Lower extremity DVT study is negative.  If D-dimer is negative will rule out PE.  D-dimer 0.05 have a low suspicion for pulmonary embolism, patient does not want a CTA.  Lower leg swelling most likely chronic venous stasis.  Discussed compression socks and elevation of her legs and follow-up with primary care provider.   Corena Herter, MD 01/15/23 2952    Corena Herter, MD 01/15/23 [id number]

## 2023-01-15 NOTE — Discharge Instructions (Addendum)
You were evaluated in the ED for leg swelling.  Your lab work is reassuring.  Your chest x-ray and bilateral ultrasound is normal.  Wear stocking compressions and keep legs elevated to help with swelling.  Follow-up with primary care and cardiology for further evaluation.

## 2023-01-15 NOTE — ED Notes (Signed)
Pt d/c home per MD order. Discharge summary reviewed, pt verbalizes understanding. Ambulatory off unit. No s/s of acute distress noted.  

## 2024-01-30 NOTE — Progress Notes (Signed)
    Assessment & Plan:  1. Shortness of breath (Primary) - Cardiopulmonary exercise test; Future  2. Percieved Hypoxia   Patient Instructions  We are going to get breathing tests and a cardiopulmonary stress test this will have to be done in [place].  We will see you in follow-up after those tests are done.  Please note: late entry documentation due to logistical difficulties during COVID-19 pandemic. This note is filed for information purposes only, and is not intended to be used for billing, nor does it represent the full scope/nature of the visit in question. Please see any associated scanned media linked to date of encounter for additional pertinent information.  Subjective:    HPI: Isabel Meyers is a 53 y.o. female presenting to the pulmonology clinic on 08/02/2019 with report of: Pulmonary Consult (Referred by Harlene Curtis, NP for sob. Patient reports that she sob and fluctuations in oxygen levels. )     No outpatient encounter medications on file as of 08/02/2019.   No facility-administered encounter medications on file as of 08/02/2019.      Objective:   Vitals:   08/02/19 1028  BP: 114/68  Pulse: 92  Height: 5' 2.5 (1.588 m)  Weight: 140 lb 9.6 oz (63.8 kg)  SpO2: 98%  BMI (Calculated): 25.29     Physical exam documentation is limited by delayed entry of information.

## 2024-02-09 IMAGING — MG MM DIGITAL DIAGNOSTIC UNILAT*L* IMPLANT W/ TOMO W/ CAD
4 series · 4 of 12 positions shown · non-contrast
Comparison: Previous exam(s).

CLINICAL DATA: Patient recalled from screening for left breast
asymmetry.

EXAM:
DIGITAL DIAGNOSTIC UNILATERAL LEFT MAMMOGRAM WITH IMPLANTS, CAD AND
TOMOSYNTHESIS; ULTRASOUND LEFT BREAST LIMITED
TECHNIQUE: Left digital diagnostic mammography and breast tomosynthesis was
performed. The images were evaluated with computer-aided detection.
Standard and/or implant displaced views were performed.; Targeted
ultrasound examination of the left breast was performed.

[L CC synth-2D]
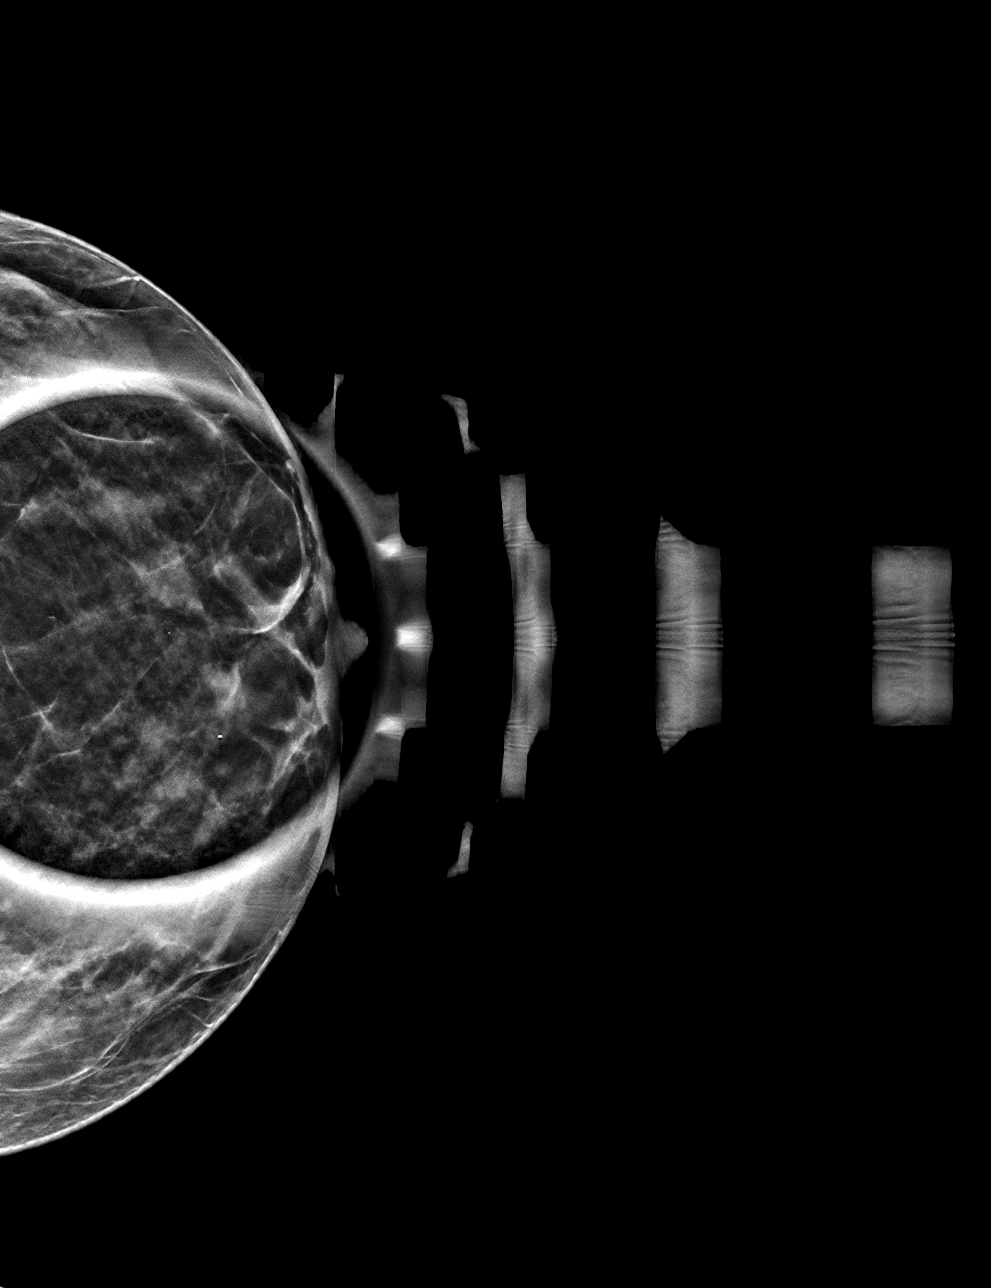

[L MLO synth-2D]
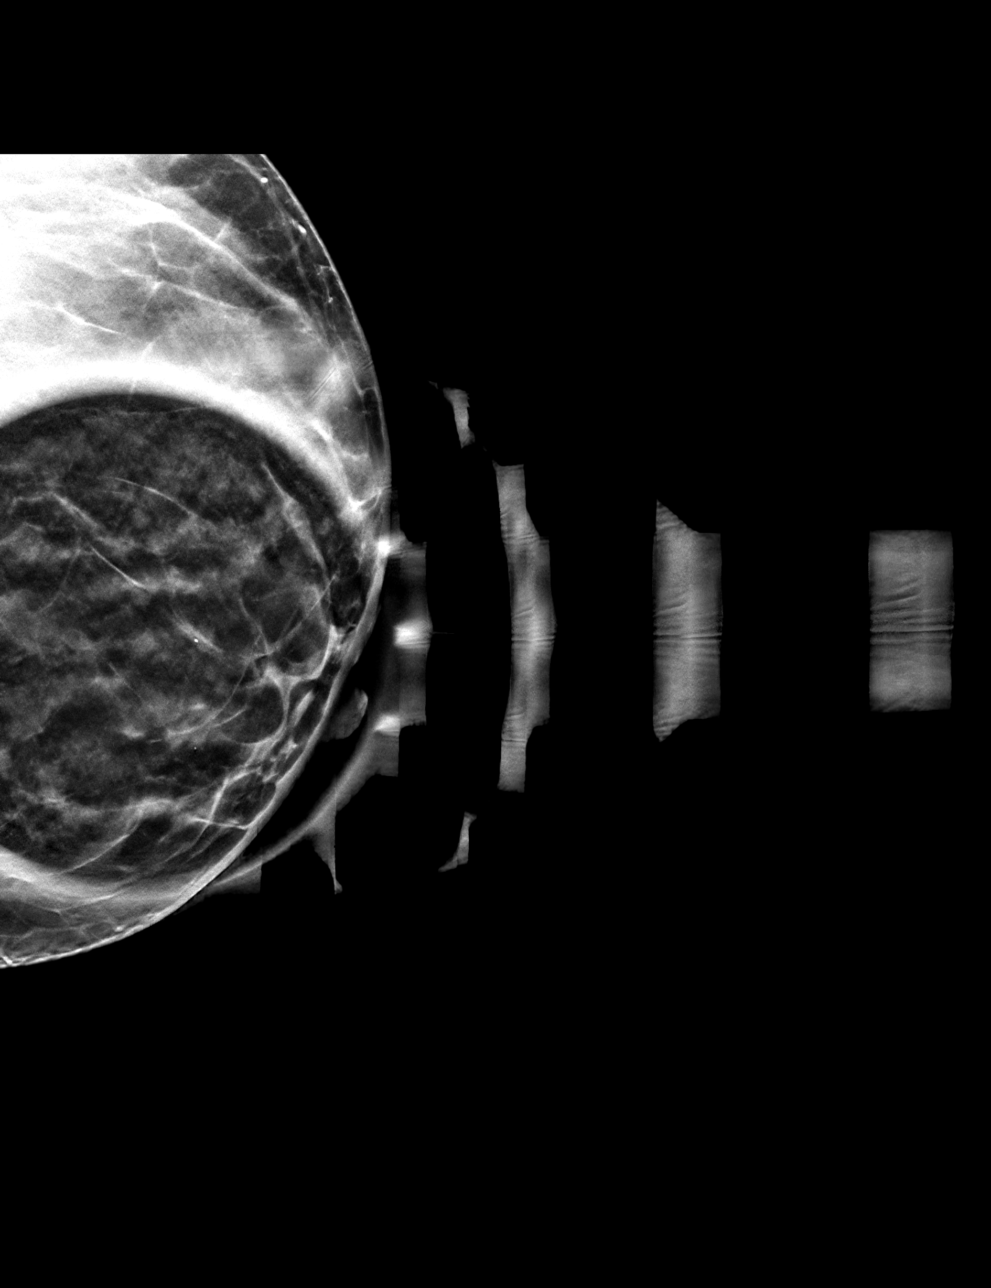

[L CCID BREAST TOMOSYNTHESIS IMAGE tomo · tomo slice 18/35.0]
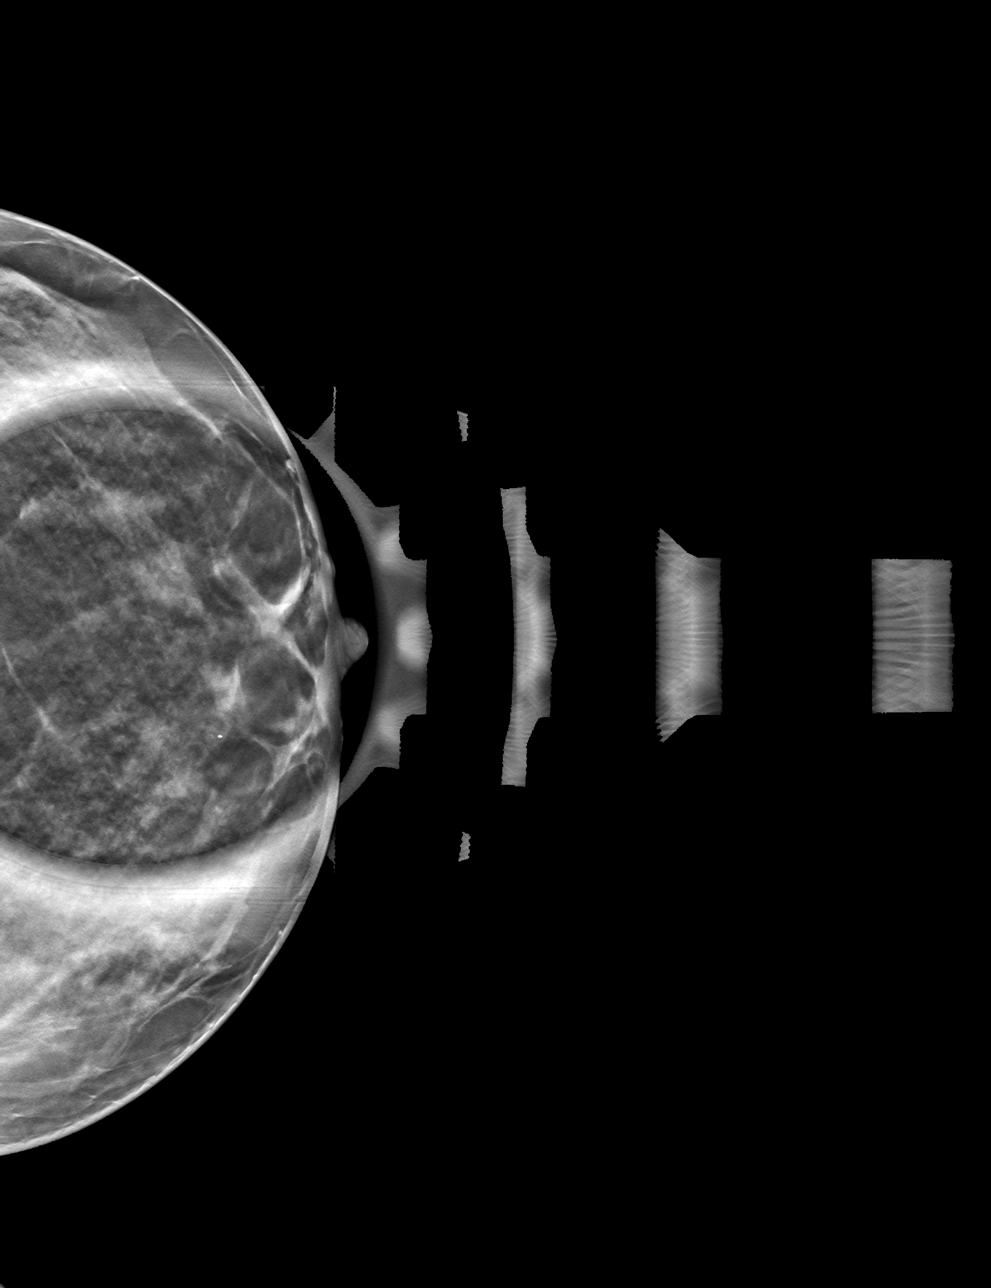

[L MLOID BREAST TOMOSYNTHESIS IMAGE tomo · tomo slice 18/35.0]
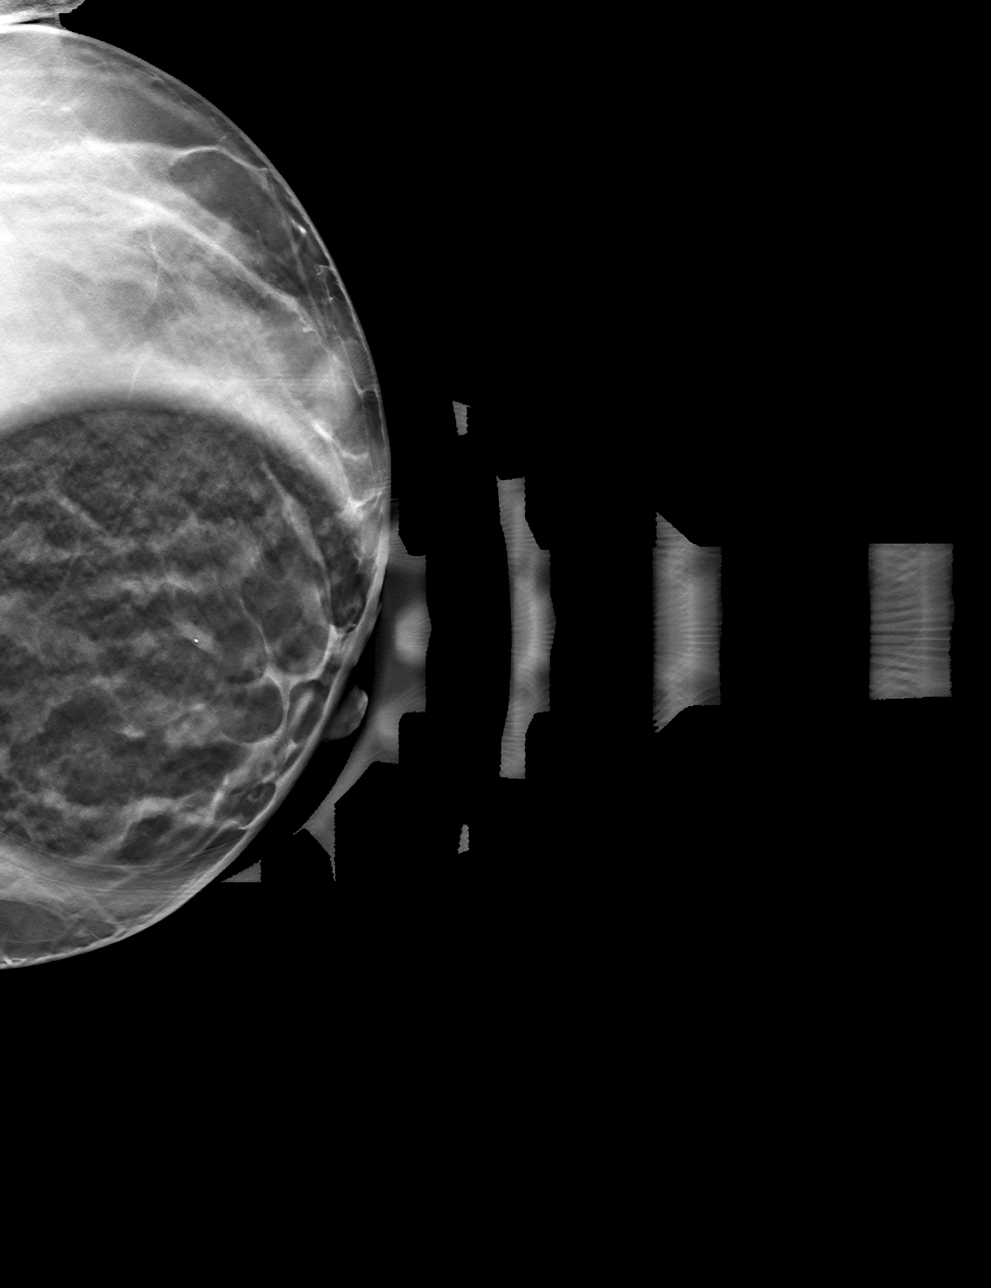

[4 of 12 positions shown; findings below may reference images not displayed]

ACR Breast Density Category c: The breast tissue is heterogeneously
dense, which may obscure small masses.
FINDINGS: Within the inferior left breast middle depth there is a persistent
partially obscured oval low-density mass. The patient has
retropectoral implants.

Targeted ultrasound is performed, showing multiple cysts within the
left breast. There is a 5 x 4 x 2 mm cyst left breast 6 o'clock
position 2 cm from the nipple, an adjacent 6 x 5 x 2 mm cyst left
breast 4 o'clock position 3 cm from nipple and an adjacent 5 x 7 x 3
mm cyst left breast 6 o'clock position retroareolar location.
IMPRESSION: Multiple cysts left breast. No mammographic evidence for malignancy.

RECOMMENDATION:
Screening mammogram in one year.(Code:[id number]-N-K72)

I have discussed the findings and recommendations with the patient.
If applicable, a reminder letter will be sent to the patient
regarding the next appointment.

BI-RADS CATEGORY  2: Benign.
